# Patient Record
Sex: Female | Born: 1970 | Race: Black or African American | Hispanic: No | Marital: Single | Smoking: Former smoker
Health system: Southern US, Community
[De-identification: ages and names within clinical notes are randomized; demographics above are authoritative.]

## PROBLEM LIST (undated history)

## (undated) DIAGNOSIS — F909 Attention-deficit hyperactivity disorder, unspecified type: Secondary | ICD-10-CM

## (undated) DIAGNOSIS — D649 Anemia, unspecified: Secondary | ICD-10-CM

## (undated) DIAGNOSIS — F17200 Nicotine dependence, unspecified, uncomplicated: Secondary | ICD-10-CM

## (undated) DIAGNOSIS — F1090 Alcohol use, unspecified, uncomplicated: Secondary | ICD-10-CM

## (undated) DIAGNOSIS — Z5189 Encounter for other specified aftercare: Secondary | ICD-10-CM

## (undated) DIAGNOSIS — F319 Bipolar disorder, unspecified: Secondary | ICD-10-CM

## (undated) DIAGNOSIS — F410 Panic disorder [episodic paroxysmal anxiety] without agoraphobia: Secondary | ICD-10-CM

## (undated) DIAGNOSIS — I10 Essential (primary) hypertension: Secondary | ICD-10-CM

## (undated) DIAGNOSIS — F1290 Cannabis use, unspecified, uncomplicated: Secondary | ICD-10-CM

## (undated) DIAGNOSIS — Z7289 Other problems related to lifestyle: Secondary | ICD-10-CM

## (undated) DIAGNOSIS — M1990 Unspecified osteoarthritis, unspecified site: Secondary | ICD-10-CM

## (undated) DIAGNOSIS — F4310 Post-traumatic stress disorder, unspecified: Secondary | ICD-10-CM

## (undated) DIAGNOSIS — F209 Schizophrenia, unspecified: Secondary | ICD-10-CM

## (undated) DIAGNOSIS — D219 Benign neoplasm of connective and other soft tissue, unspecified: Secondary | ICD-10-CM

## (undated) DIAGNOSIS — Z789 Other specified health status: Secondary | ICD-10-CM

## (undated) DIAGNOSIS — F429 Obsessive-compulsive disorder, unspecified: Secondary | ICD-10-CM

## (undated) HISTORY — PX: DILATION AND CURETTAGE OF UTERUS: SHX78

## (undated) HISTORY — DX: Unspecified osteoarthritis, unspecified site: M19.90

## (undated) HISTORY — PX: GANGLION CYST EXCISION: SHX1691

---

## 2011-09-06 ENCOUNTER — Encounter: Payer: Self-pay | Admitting: Neurology

## 2011-09-06 ENCOUNTER — Emergency Department (HOSPITAL_COMMUNITY)
Admission: EM | Admit: 2011-09-06 | Discharge: 2011-09-06 | Disposition: A | Discharge status: Home / Self Care | Payer: Medicaid Other | Attending: Emergency Medicine | Admitting: Emergency Medicine

## 2011-09-06 ENCOUNTER — Other Ambulatory Visit: Payer: Self-pay

## 2011-09-06 DIAGNOSIS — R4589 Other symptoms and signs involving emotional state: Secondary | ICD-10-CM | POA: Insufficient documentation

## 2011-09-06 DIAGNOSIS — M79609 Pain in unspecified limb: Secondary | ICD-10-CM | POA: Insufficient documentation

## 2011-09-06 DIAGNOSIS — R079 Chest pain, unspecified: Secondary | ICD-10-CM | POA: Insufficient documentation

## 2011-09-06 DIAGNOSIS — Z79899 Other long term (current) drug therapy: Secondary | ICD-10-CM | POA: Insufficient documentation

## 2011-09-06 DIAGNOSIS — R0609 Other forms of dyspnea: Secondary | ICD-10-CM | POA: Insufficient documentation

## 2011-09-06 DIAGNOSIS — R112 Nausea with vomiting, unspecified: Secondary | ICD-10-CM | POA: Insufficient documentation

## 2011-09-06 DIAGNOSIS — R0989 Other specified symptoms and signs involving the circulatory and respiratory systems: Secondary | ICD-10-CM | POA: Insufficient documentation

## 2011-09-06 DIAGNOSIS — R6883 Chills (without fever): Secondary | ICD-10-CM | POA: Insufficient documentation

## 2011-09-06 DIAGNOSIS — IMO0002 Reserved for concepts with insufficient information to code with codable children: Secondary | ICD-10-CM | POA: Insufficient documentation

## 2011-09-06 DIAGNOSIS — K2970 Gastritis, unspecified, without bleeding: Secondary | ICD-10-CM | POA: Insufficient documentation

## 2011-09-06 DIAGNOSIS — R10816 Epigastric abdominal tenderness: Secondary | ICD-10-CM | POA: Insufficient documentation

## 2011-09-06 DIAGNOSIS — R1013 Epigastric pain: Secondary | ICD-10-CM | POA: Insufficient documentation

## 2011-09-06 HISTORY — DX: Post-traumatic stress disorder, unspecified: F43.10

## 2011-09-06 HISTORY — DX: Schizophrenia, unspecified: F20.9

## 2011-09-06 HISTORY — DX: Attention-deficit hyperactivity disorder, unspecified type: F90.9

## 2011-09-06 HISTORY — DX: Bipolar disorder, unspecified: F31.9

## 2011-09-06 LAB — CBC
HCT: 32.2 % — ABNORMAL LOW (ref 36.0–46.0)
Hemoglobin: 10.5 g/dL — ABNORMAL LOW (ref 12.0–15.0)
MCH: 26 pg (ref 26.0–34.0)
MCHC: 32.6 g/dL (ref 30.0–36.0)
MCV: 79.7 fL (ref 78.0–100.0)

## 2011-09-06 LAB — URINALYSIS, ROUTINE W REFLEX MICROSCOPIC
Bilirubin Urine: NEGATIVE
Nitrite: NEGATIVE
Specific Gravity, Urine: 1.023 (ref 1.005–1.030)
Urobilinogen, UA: 0.2 mg/dL (ref 0.0–1.0)

## 2011-09-06 LAB — PREGNANCY, URINE: Preg Test, Ur: NEGATIVE

## 2011-09-06 LAB — DIFFERENTIAL
Basophils Relative: 0 % (ref 0–1)
Eosinophils Absolute: 0 10*3/uL (ref 0.0–0.7)
Eosinophils Relative: 0 % (ref 0–5)
Monocytes Absolute: 0.2 10*3/uL (ref 0.1–1.0)
Monocytes Relative: 2 % — ABNORMAL LOW (ref 3–12)

## 2011-09-06 LAB — COMPREHENSIVE METABOLIC PANEL
Albumin: 4.6 g/dL (ref 3.5–5.2)
BUN: 12 mg/dL (ref 6–23)
Chloride: 100 mEq/L (ref 96–112)
Creatinine, Ser: 0.44 mg/dL — ABNORMAL LOW (ref 0.50–1.10)
Total Bilirubin: 0.3 mg/dL (ref 0.3–1.2)
Total Protein: 8.6 g/dL — ABNORMAL HIGH (ref 6.0–8.3)

## 2011-09-06 LAB — URINE MICROSCOPIC-ADD ON

## 2011-09-06 LAB — LIPASE, BLOOD: Lipase: 14 U/L (ref 11–59)

## 2011-09-06 MED ORDER — MORPHINE SULFATE 4 MG/ML IJ SOLN
4.0000 mg | Freq: Once | INTRAMUSCULAR | Status: AC
  Administered 2011-09-06: 4 mg via INTRAVENOUS
  Filled 2011-09-06: qty 1

## 2011-09-06 MED ORDER — HYDROCODONE-ACETAMINOPHEN 5-325 MG PO TABS: 1.0000 | ORAL_TABLET | ORAL | Status: DC | PRN

## 2011-09-06 MED ORDER — OMEPRAZOLE 20 MG PO CPDR: 20.0000 mg | DELAYED_RELEASE_CAPSULE | Freq: Every day | ORAL | Status: DC

## 2011-09-06 MED ORDER — FAMOTIDINE IN NACL 20-0.9 MG/50ML-% IV SOLN
20.0000 mg | Freq: Once | INTRAVENOUS | Status: AC
  Administered 2011-09-06: 20 mg via INTRAVENOUS
  Filled 2011-09-06: qty 50

## 2011-09-06 MED ORDER — SUCRALFATE 1 G PO TABS: 1.0000 g | ORAL_TABLET | Freq: Four times a day (QID) | ORAL | Status: DC

## 2011-09-06 MED ORDER — ONDANSETRON 8 MG PO TBDP: 8.0000 mg | ORAL_TABLET | Freq: Three times a day (TID) | ORAL | Status: DC | PRN

## 2011-09-06 MED ORDER — GI COCKTAIL ~~LOC~~
30.0000 mL | Freq: Once | ORAL | Status: AC
  Administered 2011-09-06: 30 mL via ORAL
  Filled 2011-09-06: qty 30

## 2011-09-06 MED ORDER — ONDANSETRON HCL 4 MG/2ML IJ SOLN
4.0000 mg | Freq: Once | INTRAMUSCULAR | Status: AC
  Administered 2011-09-06: 4 mg via INTRAVENOUS
  Filled 2011-09-06: qty 2

## 2011-09-06 NOTE — ED Notes (Signed)
EKG given to Dr. Lynelle Doctor, copy placed in chart. No old EKG.

## 2011-09-06 NOTE — ED Notes (Signed)
Pt reports nausea, vomiting, chills since last night at 6:00pm. C/o stomach pain, chest discomfort from "vomiting so hard". Pt alert and oriented.. NAD. Reporting  Chest discomfort 10/10. Vitals stable. Pt moving around in bed, unable to sit still.

## 2011-09-06 NOTE — ED Notes (Signed)
Pt unable to sit still, moving around in bed. Asking to get out of bed. Moaning, C/o chest discomfort and stomach pain. Pt appearing restless. Curtain open to visualize patient.

## 2011-09-06 NOTE — ED Provider Notes (Signed)
Medical screening examination/treatment/procedure(s) were performed by non-physician practitioner and as supervising physician I was immediately available for consultation/collaboration.    Vernell Townley R Ruben Pyka, MD 09/06/11 1539 

## 2011-09-06 NOTE — ED Provider Notes (Signed)
History     CSN: [id number] Arrival date & time: 09/06/2011  7:36 AM   First MD Initiated Contact with Patient 09/06/11 [phone]      Chief Complaint  Patient presents with  . Nausea  . Emesis  . Chills    (Consider location/radiation/quality/duration/timing/severity/associated sxs/prior treatment) The history is provided by the patient. The history is limited by the condition of the patient.  Pt c/o constant N/V since eating dinner at seafood restaurant yesterday evening.  Associated w/ severe epigastric pain, CP and dyspnea.  She thinks the CP may be from vomiting or acid reflux.  Denies hematemesis, diarrhea and GU symptoms.  Has taken 2 hydrocodone and nausea medicine w/out relief.  Denies drug/alcohol abuse.  No food allergies.    Past Medical History  Diagnosis Date  . Bipolar 1 disorder   . PTSD (post-traumatic stress disorder)   . ADHD (attention deficit hyperactivity disorder)   . Schizophrenia     Past Surgical History  Procedure Date  . Ganglion cyst excision     No family history on file.  History  Substance Use Topics  . Smoking status: Never Smoker   . Smokeless tobacco: Not on file  . Alcohol Use: No    OB History    Grav Para Term Preterm Abortions TAB SAB Ect Mult Living                  Review of Systems  All other systems reviewed and are negative.    Allergies  Review of patient's allergies indicates no known allergies.  Home Medications   Current Outpatient Rx  Name Route Sig Dispense Refill  . HYDROCODONE-ACETAMINOPHEN 5-500 MG PO TABS Oral Take 1 tablet by mouth every 6 (six) hours as needed. For pain     . LAMOTRIGINE 150 MG PO TABS Oral Take 150 mg by mouth daily.      Marland Kitchen PRESCRIPTION MEDICATION  Zoloft Strength unknown     . PRESCRIPTION MEDICATION  Dicyclomine  Strength unknown       BP 178/89  Pulse 74  Temp(Src) 98 F (36.7 C) (Oral)  Resp 18  SpO2 100%  LMP 08/24/2011  Physical Exam  Nursing note and vitals  reviewed. Constitutional: She is oriented to person, place, and time. She appears well-developed and well-nourished. No distress.  HENT:  Head: Normocephalic and atraumatic.  Eyes:       Normal appearance  Neck: Normal range of motion.  Cardiovascular: Normal rate and regular rhythm.   Pulmonary/Chest: Effort normal and breath sounds normal.  Abdominal: Soft. Bowel sounds are normal. She exhibits no distension and no mass. There is tenderness. There is guarding. There is no rebound.       Epigastric ttp  Musculoskeletal: Normal range of motion. She exhibits no edema.       DIffuse LE tenderness  Neurological: She is alert and oriented to person, place, and time.  Skin: Skin is warm and dry. No rash noted.  Psychiatric: She has a normal mood and affect. Her behavior is normal.       Pt restless and mildly agitated.     ED Course  Procedures (including critical care time)  Labs Reviewed  CBC - Abnormal; Notable for the following:    WBC 11.1 (*)    Hemoglobin 10.5 (*)    HCT 32.2 (*)    RDW 20.0 (*)    Platelets 424 (*)    All other components within normal limits  DIFFERENTIAL - Abnormal;  Notable for the following:    Neutrophils Relative 93 (*)    Neutro Abs 10.3 (*)    Lymphocytes Relative 6 (*)    Lymphs Abs 0.6 (*)    Monocytes Relative 2 (*)    All other components within normal limits  COMPREHENSIVE METABOLIC PANEL - Abnormal; Notable for the following:    Potassium 3.2 (*)    Glucose, Bld 143 (*)    Creatinine, Ser 0.44 (*)    Calcium 11.1 (*)    Total Protein 8.6 (*)    All other components within normal limits  URINALYSIS, ROUTINE W REFLEX MICROSCOPIC - Abnormal; Notable for the following:    APPearance CLOUDY (*)    Ketones, ur >80 (*)    Protein, ur 100 (*)    All other components within normal limits  URINE MICROSCOPIC-ADD ON - Abnormal; Notable for the following:    Bacteria, UA MANY (*)    All other components within normal limits  LIPASE, BLOOD   PREGNANCY, URINE   No results found.   1. Gastritis       MDM  Pt presents w/ epigastric/mid-line chest pain and N/V since last night.  Experiences this pain frequently.  On exam, uncomfortable appearing, restless, afebrile, abd benign but epigastric tenderness.  Labs unremarkable.  Pt most likely has gastritis.  Received a GI cocktail, IV morphine and zofran and symptoms improved.  She is tolerating pos.  Will d/c home w/ prilosec, carafate, vicodin and zofran.  Return precautions discussed.        Otilio Miu, [place] 09/06/11 1038

## 2011-09-06 NOTE — ED Provider Notes (Signed)
Date: 09/06/2011  Rate: 66  Rhythm: normal sinus rhythm  QRS Axis: normal  Intervals: normal  ST/T Wave abnormalities: normal  Conduction Disutrbances:none  Narrative Interpretation:   Old EKG Reviewed: none available  Medical screening examination/treatment/procedure(s) were performed by non-physician practitioner and as supervising physician I was immediately available for consultation/collaboration.   Celene Kras, MD 09/06/11 1540

## 2011-09-08 ENCOUNTER — Other Ambulatory Visit: Payer: Self-pay

## 2011-09-08 ENCOUNTER — Encounter (HOSPITAL_COMMUNITY): Payer: Self-pay | Admitting: *Deleted

## 2011-09-08 ENCOUNTER — Emergency Department (HOSPITAL_COMMUNITY)
Admission: EM | Admit: 2011-09-08 | Discharge: 2011-09-08 | Discharge status: Against Medical Advice | Payer: Medicaid Other | Attending: Emergency Medicine | Admitting: Emergency Medicine

## 2011-09-08 DIAGNOSIS — R0602 Shortness of breath: Secondary | ICD-10-CM | POA: Insufficient documentation

## 2011-09-08 DIAGNOSIS — R079 Chest pain, unspecified: Secondary | ICD-10-CM | POA: Insufficient documentation

## 2011-09-08 NOTE — ED Notes (Signed)
Reports sudden onset of mid and left side chest pain that occurred suddenly after eating. Having n/v x 1 and sob. spo2 100% ekg being done at triage.

## 2011-12-29 ENCOUNTER — Emergency Department (INDEPENDENT_AMBULATORY_CARE_PROVIDER_SITE_OTHER): Payer: Medicaid Other

## 2011-12-29 ENCOUNTER — Emergency Department (HOSPITAL_BASED_OUTPATIENT_CLINIC_OR_DEPARTMENT_OTHER)
Admission: EM | Admit: 2011-12-29 | Discharge: 2011-12-29 | Disposition: A | Discharge status: Home / Self Care | Payer: Medicaid Other | Attending: Emergency Medicine | Admitting: Emergency Medicine

## 2011-12-29 ENCOUNTER — Encounter (HOSPITAL_BASED_OUTPATIENT_CLINIC_OR_DEPARTMENT_OTHER): Payer: Self-pay | Admitting: Emergency Medicine

## 2011-12-29 DIAGNOSIS — E876 Hypokalemia: Secondary | ICD-10-CM | POA: Insufficient documentation

## 2011-12-29 DIAGNOSIS — R059 Cough, unspecified: Secondary | ICD-10-CM | POA: Insufficient documentation

## 2011-12-29 DIAGNOSIS — R10819 Abdominal tenderness, unspecified site: Secondary | ICD-10-CM | POA: Insufficient documentation

## 2011-12-29 DIAGNOSIS — R109 Unspecified abdominal pain: Secondary | ICD-10-CM | POA: Insufficient documentation

## 2011-12-29 DIAGNOSIS — R112 Nausea with vomiting, unspecified: Secondary | ICD-10-CM | POA: Insufficient documentation

## 2011-12-29 DIAGNOSIS — R5381 Other malaise: Secondary | ICD-10-CM | POA: Insufficient documentation

## 2011-12-29 DIAGNOSIS — R05 Cough: Secondary | ICD-10-CM

## 2011-12-29 LAB — CBC
MCH: 23.1 pg — ABNORMAL LOW (ref 26.0–34.0)
Platelets: 310 10*3/uL (ref 150–400)
RBC: 4.29 MIL/uL (ref 3.87–5.11)
RDW: 20.9 % — ABNORMAL HIGH (ref 11.5–15.5)

## 2011-12-29 LAB — DIFFERENTIAL
Basophils Absolute: 0 10*3/uL (ref 0.0–0.1)
Basophils Relative: 0 % (ref 0–1)
Eosinophils Absolute: 0 10*3/uL (ref 0.0–0.7)
Eosinophils Relative: 0 % (ref 0–5)
Lymphs Abs: 2 10*3/uL (ref 0.7–4.0)
Neutrophils Relative %: 74 % (ref 43–77)

## 2011-12-29 LAB — COMPREHENSIVE METABOLIC PANEL
ALT: 19 U/L (ref 0–35)
CO2: 26 mEq/L (ref 19–32)
Calcium: 11 mg/dL — ABNORMAL HIGH (ref 8.4–10.5)
Chloride: 100 mEq/L (ref 96–112)
Creatinine, Ser: 0.6 mg/dL (ref 0.50–1.10)
GFR calc Af Amer: >90 mL/min (ref >90)
GFR calc non Af Amer: >90 mL/min (ref >90)
Glucose, Bld: 109 mg/dL — ABNORMAL HIGH (ref 70–99)
Sodium: 137 mEq/L (ref 135–145)
Total Bilirubin: 0.4 mg/dL (ref 0.3–1.2)

## 2011-12-29 LAB — URINE MICROSCOPIC-ADD ON

## 2011-12-29 LAB — URINALYSIS, ROUTINE W REFLEX MICROSCOPIC
Glucose, UA: NEGATIVE mg/dL
Ketones, ur: NEGATIVE mg/dL
Leukocytes, UA: NEGATIVE
Nitrite: NEGATIVE
Protein, ur: 30 mg/dL — AB
Urobilinogen, UA: 1 mg/dL (ref 0.0–1.0)

## 2011-12-29 MED ORDER — POTASSIUM CHLORIDE CRYS ER 20 MEQ PO TBCR
20.0000 meq | EXTENDED_RELEASE_TABLET | Freq: Once | ORAL | Status: AC
  Administered 2011-12-29: 20 meq via ORAL
  Filled 2011-12-29: qty 1

## 2011-12-29 MED ORDER — FAMOTIDINE IN NACL 20-0.9 MG/50ML-% IV SOLN
20.0000 mg | Freq: Once | INTRAVENOUS | Status: AC
  Administered 2011-12-29: 20 mg via INTRAVENOUS
  Filled 2011-12-29: qty 50

## 2011-12-29 MED ORDER — HYOSCYAMINE SULFATE 0.125 MG PO TABS
0.2500 mg | ORAL_TABLET | Freq: Once | ORAL | Status: DC
  Filled 2011-12-29: qty 1

## 2011-12-29 MED ORDER — METOCLOPRAMIDE HCL 5 MG/ML IJ SOLN
10.0000 mg | Freq: Once | INTRAMUSCULAR | Status: AC
  Administered 2011-12-29: 10 mg via INTRAVENOUS
  Filled 2011-12-29: qty 2

## 2011-12-29 MED ORDER — HYOSCYAMINE SULFATE 0.125 MG PO TABS
0.2500 mg | ORAL_TABLET | Freq: Once | ORAL | Status: AC
  Administered 2011-12-29: 0.25 mg via ORAL

## 2011-12-29 MED ORDER — MORPHINE SULFATE 4 MG/ML IJ SOLN
4.0000 mg | Freq: Once | INTRAMUSCULAR | Status: AC
  Administered 2011-12-29: 4 mg via INTRAVENOUS
  Filled 2011-12-29: qty 1

## 2011-12-29 MED ORDER — ONDANSETRON HCL 4 MG/2ML IJ SOLN
INTRAMUSCULAR | Status: AC
  Administered 2011-12-29: 11:00:00
  Filled 2011-12-29: qty 2

## 2011-12-29 MED ORDER — HYOSCYAMINE SULFATE 0.125 MG PO TABS
0.2500 mg | ORAL_TABLET | Freq: Once | ORAL | Status: DC
  Filled 2011-12-29: qty 2

## 2011-12-29 MED ORDER — PROMETHAZINE HCL 25 MG RE SUPP: 25.0000 mg | Freq: Four times a day (QID) | RECTAL | Status: DC | PRN

## 2011-12-29 MED ORDER — ONDANSETRON HCL 4 MG/2ML IJ SOLN
8.0000 mg | Freq: Once | INTRAMUSCULAR | Status: AC
  Administered 2011-12-29: 8 mg via INTRAVENOUS
  Filled 2011-12-29: qty 2

## 2011-12-29 MED ORDER — ONDANSETRON 8 MG PO TBDP: 8.0000 mg | ORAL_TABLET | Freq: Three times a day (TID) | ORAL | Status: AC | PRN

## 2011-12-29 MED ORDER — HYOSCYAMINE SULFATE 0.125 MG SL SUBL
0.2500 mg | SUBLINGUAL_TABLET | Freq: Once | SUBLINGUAL | Status: DC
  Filled 2011-12-29: qty 2

## 2011-12-29 MED ORDER — SODIUM CHLORIDE 0.9 % IV BOLUS (SEPSIS)
1000.0000 mL | Freq: Once | INTRAVENOUS | Status: AC
  Administered 2011-12-29: 1000 mL via INTRAVENOUS

## 2011-12-29 NOTE — ED Notes (Signed)
Sara Yates and drink given to the patient per Dr. Elmer Bales order. The patient is sitting up and trying the food and drink.

## 2011-12-29 NOTE — ED Notes (Signed)
While discharging pt she asked if she could have a refill on her current vicodin 5/500mg  script.  Pt states that she does not have a current prescriber for this medication and that she needs a refill.  Pt states that she is trying to get into see pain management but is concerned because she only has 4 pills left.  Dr Brooke Dare informed, he declined to refill this medication due to her having ultram and vicodin at home.  Pt was advised per Dr Brooke Dare to take those medications for her pain and followup with PCP if she needs a refill on vicodin.  Pt voiced understanding.

## 2011-12-29 NOTE — ED Provider Notes (Signed)
History     CSN: [id number]  Arrival date & time 12/29/11  1032   First MD Initiated Contact with Patient 12/29/11 1032      Chief Complaint  Patient presents with  . Nausea  . Abdominal Pain  . Cough    (Consider location/radiation/quality/duration/timing/severity/associated sxs/prior treatment) Patient is a 41 y.o. female presenting with abdominal pain. The history is provided by the patient. No language interpreter was used.  Abdominal Pain The primary symptoms of the illness include abdominal pain, fatigue, nausea and vomiting. The primary symptoms of the illness do not include fever, shortness of breath, diarrhea or dysuria. The current episode started 2 days ago. The onset of the illness was gradual. The problem has been gradually worsening.  The abdominal pain began 2 days ago. The pain came on gradually. The abdominal pain has been gradually worsening since its onset. The abdominal pain is generalized. The abdominal pain does not radiate. The abdominal pain is relieved by nothing. The abdominal pain is exacerbated by vomiting.  Nausea began 2 days ago. The nausea is associated with eating. The nausea is exacerbated by food.  The vomiting began 2 days ago. Vomiting occurs 2 to 5 times per day. The emesis contains stomach contents.  Symptoms associated with the illness do not include chills, constipation, urgency, frequency or back pain.    Past Medical History  Diagnosis Date  . Bipolar 1 disorder   . PTSD (post-traumatic stress disorder)   . ADHD (attention deficit hyperactivity disorder)   . Schizophrenia     Past Surgical History  Procedure Date  . Ganglion cyst excision     History reviewed. No pertinent family history.  History  Substance Use Topics  . Smoking status: Never Smoker   . Smokeless tobacco: Not on file  . Alcohol Use: No    OB History    Grav Para Term Preterm Abortions TAB SAB Ect Mult Living                  Review of Systems    Constitutional: Positive for fatigue. Negative for fever, chills, activity change and appetite change.  HENT: Positive for congestion. Negative for sore throat, rhinorrhea, neck pain and neck stiffness.   Respiratory: Positive for cough. Negative for shortness of breath.   Cardiovascular: Negative for chest pain and palpitations.  Gastrointestinal: Positive for nausea, vomiting and abdominal pain. Negative for diarrhea and constipation.  Genitourinary: Negative for dysuria, urgency, frequency and flank pain.  Musculoskeletal: Negative for myalgias, back pain and arthralgias.  Neurological: Negative for dizziness, weakness, light-headedness, numbness and headaches.  All other systems reviewed and are negative.    Allergies  Review of patient's allergies indicates no known allergies.  Home Medications   Current Outpatient Rx  Name Route Sig Dispense Refill  . ARIPIPRAZOLE 15 MG PO TABS Oral Take 15 mg by mouth daily.    Marland Kitchen DICYCLOMINE HCL 10 MG PO CAPS Oral Take 10 mg by mouth 3 (three) times daily between meals as needed. For stomach spasms     . HYDROCODONE-ACETAMINOPHEN 5-500 MG PO TABS Oral Take 1 tablet by mouth every 6 (six) hours as needed.    Marland Kitchen LAMOTRIGINE 100 MG PO TABS Oral Take 100 mg by mouth daily.    Marland Kitchen LISDEXAMFETAMINE DIMESYLATE 50 MG PO CAPS Oral Take 50 mg by mouth every morning.    Marland Kitchen OMEPRAZOLE 20 MG PO CPDR Oral Take 20 mg by mouth daily.      Marland Kitchen PROMETHAZINE HCL  25 MG PO TABS Oral Take 25 mg by mouth every 6 (six) hours as needed.    . SERTRALINE HCL 100 MG PO TABS Oral Take 150 mg by mouth daily.     . TRAMADOL HCL 50 MG PO TABS Oral Take 50 mg by mouth 2 (two) times daily as needed.    . TRAZODONE HCL 100 MG PO TABS Oral Take 50 mg by mouth at bedtime.     Marland Kitchen ONDANSETRON 8 MG PO TBDP Oral Take 1 tablet (8 mg total) by mouth every 8 (eight) hours as needed for nausea. 20 tablet 0  . PROMETHAZINE HCL 25 MG RE SUPP Rectal Place 1 suppository (25 mg total) rectally every  6 (six) hours as needed for nausea. 12 each 0    BP 130/69  Pulse 58  Temp(Src) 98.4 F (36.9 C) (Oral)  Resp 20  SpO2 100%  LMP 12/22/2011  Physical Exam  Nursing note and vitals reviewed. Constitutional: She is oriented to person, place, and time. She appears well-developed and well-nourished. No distress.  HENT:  Head: Normocephalic and atraumatic.  Mouth/Throat: Oropharynx is clear and moist.  Eyes: Conjunctivae and EOM are normal. Pupils are equal, round, and reactive to light.  Neck: Normal range of motion. Neck supple.  Cardiovascular: Normal rate, regular rhythm, normal heart sounds and intact distal pulses.  Exam reveals no gallop and no friction rub.   No murmur heard. Pulmonary/Chest: Effort normal and breath sounds normal. No respiratory distress. She exhibits no tenderness.  Abdominal: Soft. Bowel sounds are normal. There is tenderness (diffuse). There is no rebound and no guarding.  Musculoskeletal: Normal range of motion. She exhibits no tenderness.  Neurological: She is alert and oriented to person, place, and time. No cranial nerve deficit.  Skin: Skin is warm and dry. No rash noted.    ED Course  Procedures (including critical care time)  Labs Reviewed  COMPREHENSIVE METABOLIC PANEL - Abnormal; Notable for the following:    Potassium 3.4 (*)    Glucose, Bld 109 (*)    Calcium 11.0 (*)    Total Protein 8.7 (*)    All other components within normal limits  CBC - Abnormal; Notable for the following:    WBC 11.8 (*)    Hemoglobin 9.9 (*)    HCT 31.2 (*)    MCV 72.7 (*)    MCH 23.1 (*)    RDW 20.9 (*)    All other components within normal limits  DIFFERENTIAL - Abnormal; Notable for the following:    Neutro Abs 8.7 (*)    Monocytes Absolute 1.1 (*)    All other components within normal limits  URINALYSIS, ROUTINE W REFLEX MICROSCOPIC - Abnormal; Notable for the following:    Hgb urine dipstick SMALL (*)    Protein, ur 30 (*)    All other components  within normal limits  URINE MICROSCOPIC-ADD ON - Abnormal; Notable for the following:    Bacteria, UA MANY (*)    Casts HYALINE CASTS (*)    All other components within normal limits  LIPASE, BLOOD   Dg Chest 2 View  12/29/2011  *RADIOLOGY REPORT*  Clinical Data: cough  CHEST - 2 VIEW  Comparison: None.  Findings: The heart size and mediastinal contours are within normal limits.  Both lungs are clear.  The visualized skeletal structures are unremarkable.  IMPRESSION: Negative exam.  Original Report Authenticated By: Rosealee Albee, M.D.     1. Nausea and vomiting   2.  Hypokalemia       MDM  Nausea and vomiting, hypokalemia. Her potassium was repleted the emergency department. She received a liter of IV fluids and antiemetics. Was able tolerate oral intake without difficulty. Will be discharged home with Tigan suppositories and Zofran ODT. She has a prescription for Bentyl. I have no concern about a malignant cause of her symptoms. This is an ongoing chronic issue.        Dayton Bailiff, MD 12/29/11 1308

## 2011-12-29 NOTE — ED Notes (Signed)
Per EMS:  Pt having N/V and abdominal pain since Friday.  Some chest wall soreness from vomiting and recent cough.  No known fever, some cold chills.

## 2011-12-29 NOTE — Discharge Instructions (Signed)
Nausea and Vomiting  Nausea is a sick feeling that often comes before throwing up (vomiting). Vomiting is a reflex where stomach contents come out of your mouth. Vomiting can cause severe loss of body fluids (dehydration). Children and elderly adults can become dehydrated quickly, especially if they also have diarrhea. Nausea and vomiting are symptoms of a condition or disease. It is important to find the cause of your symptoms.  CAUSES    Direct irritation of the stomach lining. This irritation can result from increased acid production (gastroesophageal reflux disease), infection, food poisoning, taking certain medicines (such as nonsteroidal anti-inflammatory drugs), alcohol use, or tobacco use.   Signals from the brain.These signals could be caused by a headache, heat exposure, an inner ear disturbance, increased pressure in the brain from injury, infection, a tumor, or a concussion, pain, emotional stimulus, or metabolic problems.   An obstruction in the gastrointestinal tract (bowel obstruction).   Illnesses such as diabetes, hepatitis, gallbladder problems, appendicitis, kidney problems, cancer, sepsis, atypical symptoms of a heart attack, or eating disorders.   Medical treatments such as chemotherapy and radiation.   Receiving medicine that makes you sleep (general anesthetic) during surgery.  DIAGNOSIS  Your caregiver may ask for tests to be done if the problems do not improve after a few days. Tests may also be done if symptoms are severe or if the reason for the nausea and vomiting is not clear. Tests may include:   Urine tests.   Blood tests.   Stool tests.   Cultures (to look for evidence of infection).   X-rays or other imaging studies.  Test results can help your caregiver make decisions about treatment or the need for additional tests.  TREATMENT  You need to stay well hydrated. Drink frequently but in small amounts.You may wish to drink water, sports drinks, clear broth, or eat frozen  ice pops or gelatin dessert to help stay hydrated.When you eat, eating slowly may help prevent nausea.There are also some antinausea medicines that may help prevent nausea.  HOME CARE INSTRUCTIONS    Take all medicine as directed by your caregiver.   If you do not have an appetite, do not force yourself to eat. However, you must continue to drink fluids.   If you have an appetite, eat a normal diet unless your caregiver tells you differently.   Eat a variety of complex carbohydrates (rice, wheat, potatoes, bread), lean meats, yogurt, fruits, and vegetables.   Avoid high-fat foods because they are more difficult to digest.   Drink enough water and fluids to keep your urine clear or pale yellow.   If you are dehydrated, ask your caregiver for specific rehydration instructions. Signs of dehydration may include:   Severe thirst.   Dry lips and mouth.   Dizziness.   Dark urine.   Decreasing urine frequency and amount.   Confusion.   Rapid breathing or pulse.  SEEK IMMEDIATE MEDICAL CARE IF:    You have blood or brown flecks (like coffee grounds) in your vomit.   You have black or bloody stools.   You have a severe headache or stiff neck.   You are confused.   You have severe abdominal pain.   You have chest pain or trouble breathing.   You do not urinate at least once every 8 hours.   You develop cold or clammy skin.   You continue to vomit for longer than 24 to 48 hours.   You have a fever.  MAKE SURE YOU:      Understand these instructions.   Will watch your condition.   Will get help right away if you are not doing well or get worse.  Document Released: 09/09/2005 Document Revised: 08/29/2011 Document Reviewed: 02/06/2011  [employer] Patient Information 2012 [employer]

## 2012-01-01 ENCOUNTER — Emergency Department (HOSPITAL_BASED_OUTPATIENT_CLINIC_OR_DEPARTMENT_OTHER)
Admission: EM | Admit: 2012-01-01 | Discharge: 2012-01-01 | Disposition: A | Discharge status: Home / Self Care | Payer: Medicaid Other | Attending: Emergency Medicine | Admitting: Emergency Medicine

## 2012-01-01 ENCOUNTER — Emergency Department (INDEPENDENT_AMBULATORY_CARE_PROVIDER_SITE_OTHER): Payer: Medicaid Other

## 2012-01-01 ENCOUNTER — Encounter (HOSPITAL_BASED_OUTPATIENT_CLINIC_OR_DEPARTMENT_OTHER): Payer: Self-pay | Admitting: Emergency Medicine

## 2012-01-01 DIAGNOSIS — R6883 Chills (without fever): Secondary | ICD-10-CM | POA: Insufficient documentation

## 2012-01-01 DIAGNOSIS — R5383 Other fatigue: Secondary | ICD-10-CM | POA: Insufficient documentation

## 2012-01-01 DIAGNOSIS — R112 Nausea with vomiting, unspecified: Secondary | ICD-10-CM

## 2012-01-01 DIAGNOSIS — K824 Cholesterolosis of gallbladder: Secondary | ICD-10-CM

## 2012-01-01 DIAGNOSIS — R1013 Epigastric pain: Secondary | ICD-10-CM | POA: Insufficient documentation

## 2012-01-01 DIAGNOSIS — R079 Chest pain, unspecified: Secondary | ICD-10-CM

## 2012-01-01 DIAGNOSIS — R5381 Other malaise: Secondary | ICD-10-CM | POA: Insufficient documentation

## 2012-01-01 HISTORY — DX: Panic disorder (episodic paroxysmal anxiety): F41.0

## 2012-01-01 HISTORY — DX: Obsessive-compulsive disorder, unspecified: F42.9

## 2012-01-01 LAB — DIFFERENTIAL
Basophils Absolute: 0 10*3/uL (ref 0.0–0.1)
Basophils Relative: 0 % (ref 0–1)
Eosinophils Absolute: 0 10*3/uL (ref 0.0–0.7)
Eosinophils Relative: 0 % (ref 0–5)
Lymphocytes Relative: 20 % (ref 12–46)
Lymphs Abs: 2.2 10*3/uL (ref 0.7–4.0)
Monocytes Absolute: 1.2 10*3/uL — ABNORMAL HIGH (ref 0.1–1.0)
Monocytes Relative: 10 % (ref 3–12)
Neutro Abs: 7.9 10*3/uL — ABNORMAL HIGH (ref 1.7–7.7)
Neutrophils Relative %: 70 % (ref 43–77)

## 2012-01-01 LAB — COMPREHENSIVE METABOLIC PANEL
AST: 14 U/L (ref 0–37)
Albumin: 4.5 g/dL (ref 3.5–5.2)
Creatinine, Ser: 0.8 mg/dL (ref 0.50–1.10)
Sodium: 135 mEq/L (ref 135–145)
Total Bilirubin: 0.3 mg/dL (ref 0.3–1.2)

## 2012-01-01 LAB — URINALYSIS, ROUTINE W REFLEX MICROSCOPIC
Glucose, UA: NEGATIVE mg/dL
Ketones, ur: NEGATIVE mg/dL
Leukocytes, UA: NEGATIVE
Specific Gravity, Urine: 1.015 (ref 1.005–1.030)
pH: 7 (ref 5.0–8.0)

## 2012-01-01 LAB — CBC
HCT: 29 % — ABNORMAL LOW (ref 36.0–46.0)
Hemoglobin: 9.2 g/dL — ABNORMAL LOW (ref 12.0–15.0)
MCH: 23.5 pg — ABNORMAL LOW (ref 26.0–34.0)
MCV: 74.2 fL — ABNORMAL LOW (ref 78.0–100.0)
Platelets: 189 10*3/uL (ref 150–400)
RBC: 3.91 MIL/uL (ref 3.87–5.11)
WBC: 11.3 10*3/uL — ABNORMAL HIGH (ref 4.0–10.5)

## 2012-01-01 LAB — LIPASE, BLOOD: Lipase: 60 U/L — ABNORMAL HIGH (ref 11–59)

## 2012-01-01 LAB — PREGNANCY, URINE: Preg Test, Ur: NEGATIVE

## 2012-01-01 MED ORDER — SODIUM CHLORIDE 0.9 % IV BOLUS (SEPSIS)
1000.0000 mL | Freq: Once | INTRAVENOUS | Status: AC
  Administered 2012-01-01: 1000 mL via INTRAVENOUS

## 2012-01-01 MED ORDER — GI COCKTAIL ~~LOC~~
30.0000 mL | Freq: Once | ORAL | Status: AC
  Administered 2012-01-01: 30 mL via ORAL
  Filled 2012-01-01: qty 30

## 2012-01-01 MED ORDER — OMEPRAZOLE 20 MG PO CPDR: 20.0000 mg | DELAYED_RELEASE_CAPSULE | Freq: Every day | ORAL | Status: DC

## 2012-01-01 MED ORDER — PANTOPRAZOLE SODIUM 40 MG IV SOLR
40.0000 mg | Freq: Once | INTRAVENOUS | Status: AC
  Administered 2012-01-01: 40 mg via INTRAVENOUS
  Filled 2012-01-01: qty 40

## 2012-01-01 MED ORDER — ONDANSETRON HCL 4 MG/2ML IJ SOLN
4.0000 mg | Freq: Once | INTRAMUSCULAR | Status: AC
  Administered 2012-01-01: 4 mg via INTRAVENOUS
  Filled 2012-01-01: qty 2

## 2012-01-01 MED ORDER — ONDANSETRON 4 MG PO TBDP: 4.0000 mg | ORAL_TABLET | Freq: Three times a day (TID) | ORAL | Status: AC | PRN

## 2012-01-01 MED ORDER — FENTANYL CITRATE 0.05 MG/ML IJ SOLN
50.0000 ug | Freq: Once | INTRAMUSCULAR | Status: AC
  Administered 2012-01-01: 50 ug via INTRAVENOUS
  Filled 2012-01-01: qty 2

## 2012-01-01 NOTE — ED Notes (Signed)
Secondary Assessment- Pt reports she has epigastric pain, chest pain, vomiting and unable to keep any PO fluids down.  Seen here Friday for same but not improving with medications given.

## 2012-01-01 NOTE — ED Notes (Signed)
Pt reports epigastric, chest and abdominal pain that started after eating at a restaurant Friday.  Pt reports she is unable to keep anything down and was seen for same Friday.

## 2012-01-01 NOTE — Discharge Instructions (Signed)
Nausea and Vomiting Nausea is a sick feeling that often comes before throwing up (vomiting). Vomiting is a reflex where stomach contents come out of your mouth. Vomiting can cause severe loss of body fluids (dehydration). Children and elderly adults can become dehydrated quickly, especially if they also have diarrhea. Nausea and vomiting are symptoms of a condition or disease. It is important to find the cause of your symptoms. CAUSES   Direct irritation of the stomach lining. This irritation can result from increased acid production (gastroesophageal reflux disease), infection, food poisoning, taking certain medicines (such as nonsteroidal anti-inflammatory drugs), alcohol use, or tobacco use.   Signals from the brain.These signals could be caused by a headache, heat exposure, an inner ear disturbance, increased pressure in the brain from injury, infection, a tumor, or a concussion, pain, emotional stimulus, or metabolic problems.   An obstruction in the gastrointestinal tract (bowel obstruction).   Illnesses such as diabetes, hepatitis, gallbladder problems, appendicitis, kidney problems, cancer, sepsis, atypical symptoms of a heart attack, or eating disorders.   Medical treatments such as chemotherapy and radiation.   Receiving medicine that makes you sleep (general anesthetic) during surgery.  DIAGNOSIS Your caregiver may ask for tests to be done if the problems do not improve after a few days. Tests may also be done if symptoms are severe or if the reason for the nausea and vomiting is not clear. Tests may include:  Urine tests.   Blood tests.   Stool tests.   Cultures (to look for evidence of infection).   X-rays or other imaging studies.  Test results can help your caregiver make decisions about treatment or the need for additional tests. TREATMENT You need to stay well hydrated. Drink frequently but in small amounts.You may wish to drink water, sports drinks, clear broth, or  eat frozen ice pops or gelatin dessert to help stay hydrated.When you eat, eating slowly may help prevent nausea.There are also some antinausea medicines that may help prevent nausea. HOME CARE INSTRUCTIONS   Take all medicine as directed by your caregiver.   If you do not have an appetite, do not force yourself to eat. However, you must continue to drink fluids.   If you have an appetite, eat a normal diet unless your caregiver tells you differently.   Eat a variety of complex carbohydrates (rice, wheat, potatoes, bread), lean meats, yogurt, fruits, and vegetables.   Avoid high-fat foods because they are more difficult to digest.   Drink enough water and fluids to keep your urine clear or pale yellow.   If you are dehydrated, ask your caregiver for specific rehydration instructions. Signs of dehydration may include:   Severe thirst.   Dry lips and mouth.   Dizziness.   Dark urine.   Decreasing urine frequency and amount.   Confusion.   Rapid breathing or pulse.  SEEK IMMEDIATE MEDICAL CARE IF:   You have blood or brown flecks (like coffee grounds) in your vomit.   You have black or bloody stools.   You have a severe headache or stiff neck.   You are confused.   You have severe abdominal pain.   You have chest pain or trouble breathing.   You do not urinate at least once every 8 hours.   You develop cold or clammy skin.   You continue to vomit for longer than 24 to 48 hours.   You have a fever.  MAKE SURE YOU:   Understand these instructions.   Will watch your  condition.   Will get help right away if you are not doing well or get worse.  Document Released: 09/09/2005 Document Revised: 08/29/2011 Document Reviewed: 02/06/2011 [facility] Patient Information 2012 [place], [place].Peptic Ulcers Ulcers are small, open craters or sores that develop in the lining of the stomach or the duodenum (the first part of the small intestine). The term peptic ulcer is used  to describe both types of ulcers. There are a number of treatments that relieve the discomfort of ulcers. In most cases ulcers do heal.  CAUSES AND COMMON FEATURES OF PEPTIC ULCERS  Peptic ulcers occur only in areas of the digestive system that come in contact with digestive juices. These juices are secreted (given off) by the stomach. They include acid and an enzyme called pepsin that breaks down proteins. Many people with duodenal ulcers have too much digestive juice spilling down from the stomach. Most people with gastric (stomach) ulcers have normal or below normal amounts of stomach acid. Sometimes, when the mucous membrane (protective lining) of the stomach and duodenum does not protect well, this may add to the growth of ulcers. Duodenal ulcers often produces pain in a small area between the breastbone and navel. Pain varies from hunger pain to constant gnawing or burning sensations (feeling). Sometimes the pain is felt during sleep and may awaken the person in the middle of the night. Often the pain occurs two or three hours after eating, when the stomach is empty. Other common symptoms (problems) include overeating for pain relief. Eating relieves the pain of a duodenal ulcer. Gastric ulcer pain may be felt in the same place as the pain of a duodenal ulcer, or slightly higher up. There may also be sensations of feeling full, indigestion, and heartburn. Sometimes pain occurs when the stomach is full. This causes loss of appetite followed by weight loss. HOME CARE INSTRUCTIONS   Use of tobacco products have been found to slow down the healing of an ulcer. STOP SMOKING.   Avoid alcohol, aspirin, and other inflammation (swelling and soreness) reducing drugs. These substances weaken the stomach lining.   Eat regular, nutritious meals.   Avoid foods that bother you.   Take medications and antacids as directed. Over-the-counter medications are used to neutralize stomach acid. Prescription  medications reduce acid secretion, block acid production, or provide a protective coating over the ulcer. If a specific antacid was prescribed, do not switch brands without your caregiver's approval.  Surgery is usually not necessary. Diet and/or drug therapy usually is effective. Surgery may be necessary if perforation, obstruction due to scarring, or uncontrollable bleeding is found, or if severe pain is not otherwise controlled. SEEK IMMEDIATE MEDICAL CARE IF:  You see signs of bleeding. This includes vomiting fresh, bright red blood or passing bloody or tarry, black stools.   You suffer weakness, fatigue, or loss of consciousness. These symptoms can result from severe hemorrhaging (bleeding). Shock may result.   You have sudden, intense, severe abdominal (belly) pain. This is the first sign of a perforation. This would require immediate surgical treatment.   You have intense pain and continued vomiting. This could signal an obstruction of the digestive tract.  Document Released: 09/06/2000 Document Revised: 08/29/2011 Document Reviewed: 09/05/2008 [facility] Patient Information 2012 [place], [place].

## 2012-01-01 NOTE — ED Provider Notes (Addendum)
History     CSN: [id number]  Arrival date & time 01/01/12  0830   First MD Initiated Contact with Patient 01/01/12 [phone]      No chief complaint on file.   (Consider location/radiation/quality/duration/timing/severity/associated sxs/prior treatment) HPI Comments: Patient presents complaining of persistent epigastric pain and nausea and vomiting since Friday.  She states she would not eat that night and started vomiting at the restaurant.  She was seen in this emergency department 3 days ago and had laboratory studies sent and was able to be discharged home with her symptoms having improved.  Patient notes that she's continued to have epigastric pain without radiation.  She's tried to drink fluids or any crackers and has been unable to keep much of anything down.  She's been using the medications as directed by the emergency physician at discharge and still states she cannot control her symptoms.  She notes she has had an EGD before and was not found to have an ulcer or other specific abnormalities.  No fevers but patient notes chills and cold sweats.  Patient is a 41 y.o. female presenting with abdominal pain. The history is provided by the patient. No language interpreter was used.  Abdominal Pain The primary symptoms of the illness include abdominal pain, fatigue, nausea and vomiting. The primary symptoms of the illness do not include fever, shortness of breath, diarrhea, hematemesis, hematochezia, dysuria or vaginal discharge. The current episode started more than 2 days ago. The onset of the illness was sudden. The problem has not changed since onset. Additional symptoms associated with the illness include chills and diaphoresis. Symptoms associated with the illness do not include back pain.    Past Medical History  Diagnosis Date  . Bipolar 1 disorder   . PTSD (post-traumatic stress disorder)   . ADHD (attention deficit hyperactivity disorder)   . Schizophrenia     Past Surgical  History  Procedure Date  . Ganglion cyst excision     History reviewed. No pertinent family history.  History  Substance Use Topics  . Smoking status: Never Smoker   . Smokeless tobacco: Not on file  . Alcohol Use: No    OB History    Grav Para Term Preterm Abortions TAB SAB Ect Mult Living                  Review of Systems  Constitutional: Positive for chills, diaphoresis, appetite change and fatigue. Negative for fever.  HENT: Negative.   Eyes: Negative.  Negative for discharge and redness.  Respiratory: Negative.  Negative for cough and shortness of breath.   Cardiovascular: Negative.  Negative for chest pain.  Gastrointestinal: Positive for nausea, vomiting and abdominal pain. Negative for diarrhea, hematochezia and hematemesis.  Genitourinary: Negative.  Negative for dysuria and vaginal discharge.  Musculoskeletal: Negative.  Negative for back pain.  Skin: Negative.  Negative for color change and rash.  Neurological: Negative.  Negative for syncope and headaches.  Hematological: Negative.  Negative for adenopathy.  Psychiatric/Behavioral: Negative.  Negative for confusion.  All other systems reviewed and are negative.    Allergies  Review of patient's allergies indicates no known allergies.  Home Medications   Current Outpatient Rx  Name Route Sig Dispense Refill  . ARIPIPRAZOLE 15 MG PO TABS Oral Take 15 mg by mouth daily.    Marland Kitchen DICYCLOMINE HCL 10 MG PO CAPS Oral Take 10 mg by mouth 3 (three) times daily between meals as needed. For stomach spasms     .  HYDROCODONE-ACETAMINOPHEN 5-500 MG PO TABS Oral Take 1 tablet by mouth every 6 (six) hours as needed.    Marland Kitchen LAMOTRIGINE 100 MG PO TABS Oral Take 100 mg by mouth daily.    Marland Kitchen LISDEXAMFETAMINE DIMESYLATE 50 MG PO CAPS Oral Take 50 mg by mouth every morning.    Marland Kitchen OMEPRAZOLE 20 MG PO CPDR Oral Take 20 mg by mouth daily.      Marland Kitchen ONDANSETRON 8 MG PO TBDP Oral Take 1 tablet (8 mg total) by mouth every 8 (eight) hours as  needed for nausea. 20 tablet 0  . PROMETHAZINE HCL 25 MG RE SUPP Rectal Place 1 suppository (25 mg total) rectally every 6 (six) hours as needed for nausea. 12 each 0  . PROMETHAZINE HCL 25 MG PO TABS Oral Take 25 mg by mouth every 6 (six) hours as needed.    . SERTRALINE HCL 100 MG PO TABS Oral Take 150 mg by mouth daily.     . TRAMADOL HCL 50 MG PO TABS Oral Take 50 mg by mouth 2 (two) times daily as needed.    . TRAZODONE HCL 100 MG PO TABS Oral Take 50 mg by mouth at bedtime.       BP 158/87  Pulse 75  Temp 98.3 F (36.8 C)  Resp 18  SpO2 100%  LMP 12/22/2011  Physical Exam  Nursing note and vitals reviewed. Constitutional: She is oriented to person, place, and time. She appears well-developed and well-nourished.  Non-toxic appearance. She does not have a sickly appearance.  HENT:  Head: Normocephalic and atraumatic.  Eyes: Conjunctivae, EOM and lids are normal. Pupils are equal, round, and reactive to light. No scleral icterus.  Neck: Trachea normal and normal range of motion. Neck supple.  Cardiovascular: Normal rate, regular rhythm and normal heart sounds.   Pulmonary/Chest: Effort normal and breath sounds normal. No respiratory distress. She has no wheezes. She has no rales.  Abdominal: Soft. Normal appearance. She exhibits no distension. There is no tenderness. There is no rebound, no guarding and no CVA tenderness.  Musculoskeletal: Normal range of motion.  Neurological: She is alert and oriented to person, place, and time. She has normal strength.  Skin: Skin is warm, dry and intact. No rash noted.  Psychiatric: She has a normal mood and affect. Her behavior is normal. Judgment and thought content normal.    ED Course  Procedures (including critical care time)  Results for orders placed during the hospital encounter of 01/01/12  CBC      Component Value Range   WBC 11.3 (*) 4.0 - 10.5 (K/uL)   RBC 3.91  3.87 - 5.11 (MIL/uL)   Hemoglobin 9.2 (*) 12.0 - 15.0 (g/dL)    HCT 45.4 (*) 09.8 - 46.0 (%)   MCV 74.2 (*) 78.0 - 100.0 (fL)   MCH 23.5 (*) 26.0 - 34.0 (pg)   MCHC 31.7  30.0 - 36.0 (g/dL)   RDW 11.9 (*) 14.7 - 15.5 (%)   Platelets 189  150 - 400 (K/uL)  DIFFERENTIAL      Component Value Range   Neutrophils Relative 70  43 - 77 (%)   Neutro Abs 7.9 (*) 1.7 - 7.7 (K/uL)   Lymphocytes Relative 20  12 - 46 (%)   Lymphs Abs 2.2  0.7 - 4.0 (K/uL)   Monocytes Relative 10  3 - 12 (%)   Monocytes Absolute 1.2 (*) 0.1 - 1.0 (K/uL)   Eosinophils Relative 0  0 - 5 (%)  Eosinophils Absolute 0.0  0.0 - 0.7 (K/uL)   Basophils Relative 0  0 - 1 (%)   Basophils Absolute 0.0  0.0 - 0.1 (K/uL)  COMPREHENSIVE METABOLIC PANEL      Component Value Range   Sodium 135  135 - 145 (mEq/L)   Potassium 3.7  3.5 - 5.1 (mEq/L)   Chloride 96  96 - 112 (mEq/L)   CO2 30  19 - 32 (mEq/L)   Glucose, Bld 100 (*) 70 - 99 (mg/dL)   BUN 14  6 - 23 (mg/dL)   Creatinine, Ser 3.08  0.50 - 1.10 (mg/dL)   Calcium 65.7 (*) 8.4 - 10.5 (mg/dL)   Total Protein 8.0  6.0 - 8.3 (g/dL)   Albumin 4.5  3.5 - 5.2 (g/dL)   AST 14  0 - 37 (U/L)   ALT 13  0 - 35 (U/L)   Alkaline Phosphatase 76  39 - 117 (U/L)   Total Bilirubin 0.3  0.3 - 1.2 (mg/dL)   GFR calc non Af Amer >90  >90 (mL/min)   GFR calc Af Amer >90  >90 (mL/min)  LIPASE, BLOOD      Component Value Range   Lipase 60 (*) 11 - 59 (U/L)  URINALYSIS, ROUTINE W REFLEX MICROSCOPIC      Component Value Range   Color, Urine YELLOW  YELLOW    APPearance CLOUDY (*) CLEAR    Specific Gravity, Urine 1.015  1.005 - 1.030    pH 7.0  5.0 - 8.0    Glucose, UA NEGATIVE  NEGATIVE (mg/dL)   Hgb urine dipstick NEGATIVE  NEGATIVE    Bilirubin Urine NEGATIVE  NEGATIVE    Ketones, ur NEGATIVE  NEGATIVE (mg/dL)   Protein, ur NEGATIVE  NEGATIVE (mg/dL)   Urobilinogen, UA 0.2  0.0 - 1.0 (mg/dL)   Nitrite NEGATIVE  NEGATIVE    Leukocytes, UA NEGATIVE  NEGATIVE   PREGNANCY, URINE      Component Value Range   Preg Test, Ur NEGATIVE  NEGATIVE     Dg Chest 2 View  12/29/2011  *RADIOLOGY REPORT*  Clinical Data: cough  CHEST - 2 VIEW  Comparison: None.  Findings: The heart size and mediastinal contours are within normal limits.  Both lungs are clear.  The visualized skeletal structures are unremarkable.  IMPRESSION: Negative exam.  Original Report Authenticated By: Rosealee Albee, M.D.   US Abdomen Complete  01/01/2012  *RADIOLOGY REPORT*  Clinical Data:  Nausea, vomiting.  COMPLETE ABDOMINAL ULTRASOUND  Comparison:  None.  Findings:  Gallbladder:  Echogenic nondependent nonshadowing focus in the gallbladder neck, likely small polyp, 5 mm.  No visible stones.  No wall thickening or sonographic Murphy's sign.  Common bile duct:   Normal caliber, 2 mm.  Liver:  No focal lesion identified.  Within normal limits in parenchymal echogenicity.  IVC:  Appears normal.  Pancreas:  No focal abnormality seen.  Spleen:  Within normal limits in size and echotexture.  Right Kidney:   Normal in size and parenchymal echogenicity.  No evidence of mass or hydronephrosis.  Left Kidney:  Normal in size and parenchymal echogenicity.  No evidence of mass or hydronephrosis.  Abdominal aorta:  No aneurysm identified.  IMPRESSION: Small gallbladder wall polyp within the gallbladder neck.  No stones or evidence of acute cholecystitis.  Original Report Authenticated By: Cyndie Chime, M.D.      MDM  Patient with persistent nausea and vomiting since Friday.  She notes epigastric pain as well.  I will obtain a CMP and lipase to assess for signs of hepatitis, pancreatitis or cholecystitis.  Patient has a benign abdomen on exam and does not appear to have an acute abdomen.  I will check urinalysis for UTI and a pregnancy test.  Patient will receive IV fluids, Zofran and fentanyl for pain and nausea.        Nat Christen, MD 01/01/12 [phone]  Patient with no acute auditory or ultrasound abnormalities today.  Her abdomen is soft on exam.  I do not feel she is an  acute surgical abdomen.  She shows no signs of cholecystitis or symptomatic cholelithiasis.  Her lipase is a very mild elevation which is likely more related to her intermittent vomiting but not to true pancreatitis.  At this point in time the patient may have peptic ulcer disease and I have attempted a GI cocktail to try and treat her symptoms.  Patient is now tolerating some by mouth fluids here in the department and does feel improved.  I would recommend outpatient followup with the GI specialist and give her the [facility] information.  Nat Christen, MD 01/01/12 [phone]

## 2012-08-06 ENCOUNTER — Emergency Department (HOSPITAL_BASED_OUTPATIENT_CLINIC_OR_DEPARTMENT_OTHER)
Admission: EM | Admit: 2012-08-06 | Discharge: 2012-08-06 | Disposition: A | Discharge status: Home / Self Care | Payer: Medicaid Other | Attending: Emergency Medicine | Admitting: Emergency Medicine

## 2012-08-06 ENCOUNTER — Encounter (HOSPITAL_BASED_OUTPATIENT_CLINIC_OR_DEPARTMENT_OTHER): Payer: Self-pay | Admitting: Emergency Medicine

## 2012-08-06 DIAGNOSIS — Z79899 Other long term (current) drug therapy: Secondary | ICD-10-CM | POA: Insufficient documentation

## 2012-08-06 DIAGNOSIS — F4310 Post-traumatic stress disorder, unspecified: Secondary | ICD-10-CM | POA: Insufficient documentation

## 2012-08-06 DIAGNOSIS — B3731 Acute candidiasis of vulva and vagina: Secondary | ICD-10-CM | POA: Insufficient documentation

## 2012-08-06 DIAGNOSIS — F410 Panic disorder [episodic paroxysmal anxiety] without agoraphobia: Secondary | ICD-10-CM | POA: Insufficient documentation

## 2012-08-06 DIAGNOSIS — B359 Dermatophytosis, unspecified: Secondary | ICD-10-CM | POA: Insufficient documentation

## 2012-08-06 DIAGNOSIS — F429 Obsessive-compulsive disorder, unspecified: Secondary | ICD-10-CM | POA: Insufficient documentation

## 2012-08-06 DIAGNOSIS — F209 Schizophrenia, unspecified: Secondary | ICD-10-CM | POA: Insufficient documentation

## 2012-08-06 DIAGNOSIS — F3160 Bipolar disorder, current episode mixed, unspecified: Secondary | ICD-10-CM | POA: Insufficient documentation

## 2012-08-06 DIAGNOSIS — B373 Candidiasis of vulva and vagina: Secondary | ICD-10-CM | POA: Insufficient documentation

## 2012-08-06 DIAGNOSIS — F909 Attention-deficit hyperactivity disorder, unspecified type: Secondary | ICD-10-CM | POA: Insufficient documentation

## 2012-08-06 HISTORY — DX: Anemia, unspecified: D64.9

## 2012-08-06 LAB — URINALYSIS, ROUTINE W REFLEX MICROSCOPIC
Bilirubin Urine: NEGATIVE
Glucose, UA: NEGATIVE mg/dL
Ketones, ur: NEGATIVE mg/dL
Protein, ur: NEGATIVE mg/dL
Urobilinogen, UA: 0.2 mg/dL (ref 0.0–1.0)

## 2012-08-06 LAB — WET PREP, GENITAL
Trich, Wet Prep: NONE SEEN
Yeast Wet Prep HPF POC: NONE SEEN

## 2012-08-06 LAB — URINE MICROSCOPIC-ADD ON

## 2012-08-06 LAB — HIV ANTIBODY (ROUTINE TESTING W REFLEX): HIV: NONREACTIVE

## 2012-08-06 LAB — PREGNANCY, URINE: Preg Test, Ur: NEGATIVE

## 2012-08-06 MED ORDER — CLOTRIMAZOLE 1 % EX CREA: TOPICAL_CREAM | CUTANEOUS | Status: DC

## 2012-08-06 MED ORDER — FLUCONAZOLE 150 MG PO TABS: 150.0000 mg | ORAL_TABLET | Freq: Every day | ORAL | Status: AC

## 2012-08-06 NOTE — ED Notes (Signed)
Pt c/o vaginal itching and burning for several days. Pt also c/ rash on Lt foot.

## 2012-08-06 NOTE — ED Provider Notes (Signed)
History     CSN: [id number]  Arrival date & time 08/06/12  0845   None     No chief complaint on file.   (Consider location/radiation/quality/duration/timing/severity/associated sxs/prior treatment) The history is provided by the patient.   41 year old female had an episode of unprotected sex about one week ago, and the next day, and noted that she was having some vaginal burning and itching. There's not been any discharge she was aware of. She denies any urinary urgency, frequency, tenesmus, dysuria. Denies any abdominal pain, nausea, vomiting. She's concerned that she may have contracted an STD and wishes to be checked out. There was oral sex involved as well and she wanted her throat swabbed. Separate complaint, she has had some itching on her left foot for the last 3 weeks which is getting worse.  Past Medical History  Diagnosis Date  . Bipolar 1 disorder   . PTSD (post-traumatic stress disorder)   . ADHD (attention deficit hyperactivity disorder)   . Schizophrenia   . OCD (obsessive compulsive disorder)   . Panic     Past Surgical History  Procedure Date  . Ganglion cyst excision     No family history on file.  History  Substance Use Topics  . Smoking status: Never Smoker   . Smokeless tobacco: Not on file  . Alcohol Use: No    OB History    Grav Para Term Preterm Abortions TAB SAB Ect Mult Living                  Review of Systems  All other systems reviewed and are negative.    Allergies  Review of patient's allergies indicates no known allergies.  Home Medications   Current Outpatient Rx  Name  Route  Sig  Dispense  Refill  . ARIPIPRAZOLE 15 MG PO TABS   Oral   Take 15 mg by mouth daily.         Marland Kitchen DICYCLOMINE HCL 10 MG PO CAPS   Oral   Take 10 mg by mouth 3 (three) times daily between meals as needed. For stomach spasms          . HYDROCODONE-ACETAMINOPHEN 5-500 MG PO TABS   Oral   Take 1 tablet by mouth every 6 (six) hours as needed.         Marland Kitchen LAMOTRIGINE 100 MG PO TABS   Oral   Take 100 mg by mouth daily.         Marland Kitchen LISDEXAMFETAMINE DIMESYLATE 50 MG PO CAPS   Oral   Take 50 mg by mouth every morning.         Marland Kitchen OMEPRAZOLE 20 MG PO CPDR   Oral   Take 20 mg by mouth daily.           Marland Kitchen OMEPRAZOLE 20 MG PO CPDR   Oral   Take 1 capsule (20 mg total) by mouth daily.   30 capsule   0   . PROMETHAZINE HCL 25 MG RE SUPP   Rectal   Place 1 suppository (25 mg total) rectally every 6 (six) hours as needed for nausea.   12 each   0   . PROMETHAZINE HCL 25 MG PO TABS   Oral   Take 25 mg by mouth every 6 (six) hours as needed.         . SERTRALINE HCL 100 MG PO TABS   Oral   Take 150 mg by mouth daily.          Marland Kitchen  TRAMADOL HCL 50 MG PO TABS   Oral   Take 50 mg by mouth 2 (two) times daily as needed.         . TRAZODONE HCL 100 MG PO TABS   Oral   Take 50 mg by mouth at bedtime.            There were no vitals taken for this visit.  Physical Exam  Nursing note and vitals reviewed. 41 year old female, resting comfortably and in no acute distress. Vital signs are normal. Oxygen saturation is 100%, which is normal. Head is normocephalic and atraumatic. PERRLA, EOMI. Oropharynx is clear. Neck is nontender and supple without adenopathy or JVD. Back is nontender and there is no CVA tenderness. Lungs are clear without rales, wheezes, or rhonchi. Chest is nontender. Heart has regular rate and rhythm without murmur. Abdomen is soft, flat, nontender without masses or hepatosplenomegaly and peristalsis is normoactive. Pelvic: Normal external female genitalia, small to moderate amount of blood in the vaginal vault consistent with menstrual flow, cervix appears normal, fundus is enlarged to 12-14 week size, there no adnexal masses or tenderness, there is no cervical motion tenderness. Extremities have no cyanosis or edema, full range of motion is present. Skin is warm and dry. There is discoloration and  thickening of the skin of the dorsum and dorsolateral aspect of the left foot consistent with tinea corporis. Neurologic: Mental status is normal, cranial nerves are intact, there are no motor or sensory deficits.   ED Course  Procedures (including critical care time)  Results for orders placed during the hospital encounter of 08/06/12  URINALYSIS, ROUTINE W REFLEX MICROSCOPIC      Component Value Range   Color, Urine YELLOW  YELLOW   APPearance CLOUDY (*) CLEAR   Specific Gravity, Urine 1.027  1.005 - 1.030   pH 5.0  5.0 - 8.0   Glucose, UA NEGATIVE  NEGATIVE mg/dL   Hgb urine dipstick LARGE (*) NEGATIVE   Bilirubin Urine NEGATIVE  NEGATIVE   Ketones, ur NEGATIVE  NEGATIVE mg/dL   Protein, ur NEGATIVE  NEGATIVE mg/dL   Urobilinogen, UA 0.2  0.0 - 1.0 mg/dL   Nitrite NEGATIVE  NEGATIVE   Leukocytes, UA NEGATIVE  NEGATIVE  PREGNANCY, URINE      Component Value Range   Preg Test, Ur NEGATIVE  NEGATIVE  WET PREP, GENITAL      Component Value Range   Yeast Wet Prep HPF POC NONE SEEN  NONE SEEN   Trich, Wet Prep NONE SEEN  NONE SEEN   Clue Cells Wet Prep HPF POC FEW (*) NONE SEEN   WBC, Wet Prep HPF POC FEW (*) NONE SEEN  URINE MICROSCOPIC-ADD ON      Component Value Range   Squamous Epithelial / LPF FEW (*) RARE   WBC, UA 3-6  <3 WBC/hpf   RBC / HPF TOO NUMEROUS TO COUNT  <3 RBC/hpf   Bacteria, UA MANY (*) RARE   Urine-Other RARE YEAST       1. Monilial vaginitis   2. Tinea       MDM  Episode of unprotected sex without definite evidence of STD. Specimens were sent for cultures and she is offered prophylactic treatment for STD which he has declined. Foot rash seems most likely to be fungal and will be treated with topical antifungal agents.  No yeast were seen on wet prep, but they were seen on urinalysis. She'll be treated for monilial vaginitis as this does seem  to fit her clinical presentation.     Dione Booze, MD 08/06/12 1012

## 2012-08-07 LAB — URINE CULTURE

## 2012-08-07 LAB — GC/CHLAMYDIA PROBE AMP: GC Probe RNA: NEGATIVE

## 2013-01-04 ENCOUNTER — Emergency Department (HOSPITAL_BASED_OUTPATIENT_CLINIC_OR_DEPARTMENT_OTHER)
Admission: EM | Admit: 2013-01-04 | Discharge: 2013-01-04 | Disposition: A | Discharge status: Home / Self Care | Payer: Medicaid Other | Attending: Emergency Medicine | Admitting: Emergency Medicine

## 2013-01-04 ENCOUNTER — Encounter (HOSPITAL_BASED_OUTPATIENT_CLINIC_OR_DEPARTMENT_OTHER): Payer: Self-pay | Admitting: Emergency Medicine

## 2013-01-04 DIAGNOSIS — F429 Obsessive-compulsive disorder, unspecified: Secondary | ICD-10-CM | POA: Insufficient documentation

## 2013-01-04 DIAGNOSIS — F209 Schizophrenia, unspecified: Secondary | ICD-10-CM | POA: Insufficient documentation

## 2013-01-04 DIAGNOSIS — D649 Anemia, unspecified: Secondary | ICD-10-CM | POA: Insufficient documentation

## 2013-01-04 DIAGNOSIS — Z3202 Encounter for pregnancy test, result negative: Secondary | ICD-10-CM | POA: Insufficient documentation

## 2013-01-04 DIAGNOSIS — F4310 Post-traumatic stress disorder, unspecified: Secondary | ICD-10-CM | POA: Insufficient documentation

## 2013-01-04 DIAGNOSIS — F909 Attention-deficit hyperactivity disorder, unspecified type: Secondary | ICD-10-CM | POA: Insufficient documentation

## 2013-01-04 DIAGNOSIS — Z79899 Other long term (current) drug therapy: Secondary | ICD-10-CM | POA: Insufficient documentation

## 2013-01-04 DIAGNOSIS — K5289 Other specified noninfective gastroenteritis and colitis: Secondary | ICD-10-CM | POA: Insufficient documentation

## 2013-01-04 DIAGNOSIS — R112 Nausea with vomiting, unspecified: Secondary | ICD-10-CM | POA: Insufficient documentation

## 2013-01-04 DIAGNOSIS — R079 Chest pain, unspecified: Secondary | ICD-10-CM | POA: Insufficient documentation

## 2013-01-04 DIAGNOSIS — Z87891 Personal history of nicotine dependence: Secondary | ICD-10-CM | POA: Insufficient documentation

## 2013-01-04 DIAGNOSIS — K529 Noninfective gastroenteritis and colitis, unspecified: Secondary | ICD-10-CM

## 2013-01-04 DIAGNOSIS — F319 Bipolar disorder, unspecified: Secondary | ICD-10-CM | POA: Insufficient documentation

## 2013-01-04 LAB — COMPREHENSIVE METABOLIC PANEL
CO2: 22 mEq/L (ref 19–32)
Calcium: 10.2 mg/dL (ref 8.4–10.5)
Creatinine, Ser: 0.6 mg/dL (ref 0.50–1.10)
GFR calc Af Amer: >90 mL/min (ref >90)
GFR calc non Af Amer: >90 mL/min (ref >90)
Glucose, Bld: 142 mg/dL — ABNORMAL HIGH (ref 70–99)

## 2013-01-04 LAB — CBC WITH DIFFERENTIAL/PLATELET
Eosinophils Relative: 0 % (ref 0–5)
HCT: 33.5 % — ABNORMAL LOW (ref 36.0–46.0)
Lymphs Abs: 0.8 10*3/uL (ref 0.7–4.0)
MCH: 23.9 pg — ABNORMAL LOW (ref 26.0–34.0)
MCV: 74.9 fL — ABNORMAL LOW (ref 78.0–100.0)
Monocytes Relative: 2 % — ABNORMAL LOW (ref 3–12)
Neutro Abs: 10.5 10*3/uL — ABNORMAL HIGH (ref 1.7–7.7)
RBC: 4.47 MIL/uL (ref 3.87–5.11)
RDW: 23.7 % — ABNORMAL HIGH (ref 11.5–15.5)
WBC: 11.5 10*3/uL — ABNORMAL HIGH (ref 4.0–10.5)

## 2013-01-04 LAB — URINALYSIS, ROUTINE W REFLEX MICROSCOPIC
Bilirubin Urine: NEGATIVE
Glucose, UA: NEGATIVE mg/dL
Hgb urine dipstick: NEGATIVE
Protein, ur: 30 mg/dL — AB

## 2013-01-04 LAB — URINE MICROSCOPIC-ADD ON

## 2013-01-04 LAB — LIPASE, BLOOD: Lipase: 19 U/L (ref 11–59)

## 2013-01-04 MED ORDER — ONDANSETRON HCL 4 MG PO TABS: 4.0000 mg | ORAL_TABLET | Freq: Three times a day (TID) | ORAL | Status: DC | PRN

## 2013-01-04 MED ORDER — ONDANSETRON HCL 4 MG/2ML IJ SOLN
4.0000 mg | Freq: Once | INTRAMUSCULAR | Status: AC
  Administered 2013-01-04: 4 mg via INTRAVENOUS
  Filled 2013-01-04: qty 2

## 2013-01-04 MED ORDER — SODIUM CHLORIDE 0.9 % IV BOLUS (SEPSIS)
1000.0000 mL | Freq: Once | INTRAVENOUS | Status: AC
Start: 2013-01-04 — End: 2013-01-04
  Administered 2013-01-04: 1000 mL via INTRAVENOUS

## 2013-01-04 MED ORDER — HYDROMORPHONE HCL PF 1 MG/ML IJ SOLN
1.0000 mg | Freq: Once | INTRAMUSCULAR | Status: AC
  Administered 2013-01-04: 1 mg via INTRAVENOUS
  Filled 2013-01-04: qty 1

## 2013-01-04 MED ORDER — OXYCODONE-ACETAMINOPHEN 5-325 MG PO TABS: 1.0000 | ORAL_TABLET | Freq: Four times a day (QID) | ORAL | Status: DC | PRN

## 2013-01-04 MED ORDER — PANTOPRAZOLE SODIUM 40 MG IV SOLR
40.0000 mg | Freq: Once | INTRAVENOUS | Status: AC
  Administered 2013-01-04: 40 mg via INTRAVENOUS
  Filled 2013-01-04: qty 40

## 2013-01-04 NOTE — ED Provider Notes (Signed)
History     CSN: [id number]  Arrival date & time 01/04/13  2140   First MD Initiated Contact with Patient 01/04/13 2151      Chief Complaint  Patient presents with  . Emesis  . Abdominal Pain    (Consider location/radiation/quality/duration/timing/severity/associated sxs/prior treatment) HPI  Past Medical History  Diagnosis Date  . Bipolar 1 disorder   . PTSD (post-traumatic stress disorder)   . ADHD (attention deficit hyperactivity disorder)   . Schizophrenia   . OCD (obsessive compulsive disorder)   . Panic   . Anemia     Past Surgical History  Procedure Laterality Date  . Ganglion cyst excision      No family history on file.  History  Substance Use Topics  . Smoking status: Former Games developer  . Smokeless tobacco: Not on file  . Alcohol Use: Yes     Comment: occasionally    OB History   Grav Para Term Preterm Abortions TAB SAB Ect Mult Living                  Review of Systems  Allergies  Review of patient's allergies indicates no known allergies.  Home Medications   Current Outpatient Rx  Name  Route  Sig  Dispense  Refill  . ferrous sulfate 325 (65 FE) MG tablet   Oral   Take 325 mg by mouth daily with breakfast.         . lamoTRIgine (LAMICTAL) 100 MG tablet   Oral   Take 100 mg by mouth daily.         Marland Kitchen lisdexamfetamine (VYVANSE) 50 MG capsule   Oral   Take 50 mg by mouth every morning.         . ARIPiprazole (ABILIFY) 15 MG tablet   Oral   Take 15 mg by mouth daily.         . clotrimazole (LOTRIMIN) 1 % cream      Apply to affected area 2 times daily   60 g   0   . dicyclomine (BENTYL) 10 MG capsule   Oral   Take 10 mg by mouth 3 (three) times daily between meals as needed. For stomach spasms          . HYDROcodone-acetaminophen (VICODIN) 5-500 MG per tablet   Oral   Take 1 tablet by mouth every 6 (six) hours as needed.         Marland Kitchen EXPIRED: omeprazole (PRILOSEC) 20 MG capsule   Oral   Take 20 mg by mouth daily.            Marland Kitchen EXPIRED: omeprazole (PRILOSEC) 20 MG capsule   Oral   Take 1 capsule (20 mg total) by mouth daily.   30 capsule   0   . Ondansetron HCl (ZOFRAN IV)   Intravenous   Inject 4 mg into the vein.         Marland Kitchen EXPIRED: promethazine (PHENERGAN) 25 MG suppository   Rectal   Place 1 suppository (25 mg total) rectally every 6 (six) hours as needed for nausea.   12 each   0   . promethazine (PHENERGAN) 25 MG tablet   Oral   Take 25 mg by mouth every 6 (six) hours as needed.         . sertraline (ZOLOFT) 100 MG tablet   Oral   Take 150 mg by mouth daily.          . traMADol (ULTRAM) 50 MG tablet  Oral   Take 50 mg by mouth 2 (two) times daily as needed.         . traZODone (DESYREL) 100 MG tablet   Oral   Take 50 mg by mouth at bedtime.            BP 188/99  Pulse 89  Temp(Src) 98.3 F (36.8 C) (Oral)  Resp 18  Ht 6' (1.829 m)  Wt 175 lb (79.379 kg)  BMI 23.73 kg/m2  SpO2 100%  Physical Exam  ED Course  Procedures (including critical care time)  Labs Reviewed  URINALYSIS, ROUTINE W REFLEX MICROSCOPIC - Abnormal; Notable for the following:    APPearance TURBID (*)    Ketones, ur 15 (*)    Protein, ur 30 (*)    All other components within normal limits  CBC WITH DIFFERENTIAL - Abnormal; Notable for the following:    WBC 11.5 (*)    Hemoglobin 10.7 (*)    HCT 33.5 (*)    MCV 74.9 (*)    MCH 23.9 (*)    RDW 23.7 (*)    Neutrophils Relative 91 (*)    Lymphocytes Relative 7 (*)    Monocytes Relative 2 (*)    Neutro Abs 10.5 (*)    All other components within normal limits  COMPREHENSIVE METABOLIC PANEL - Abnormal; Notable for the following:    Potassium 3.3 (*)    Glucose, Bld 142 (*)    Total Protein 8.5 (*)    Total Bilirubin 0.2 (*)    All other components within normal limits  URINE MICROSCOPIC-ADD ON - Abnormal; Notable for the following:    Squamous Epithelial / LPF FEW (*)    All other components within normal limits  PREGNANCY,  URINE  LIPASE, BLOOD   No results found.   1. Gastroenteritis       MDM  Labs reviewed, no significant abnormalities. Feeling better, pain improved. Will attempt PO trial.         Leonette Most B. Bernette Mayers, MD 01/04/13 2316

## 2013-01-04 NOTE — ED Provider Notes (Signed)
History  This chart was scribed for Sara Yates B. Bernette Mayers, MD by Ardeen Jourdain, ED Scribe. This patient was seen in room MH08/MH08 and the patient's care was started at 2151.  CSN: [id number]  Arrival date & time 01/04/13  2140   First MD Initiated Contact with Patient 01/04/13 2151      Chief Complaint  Patient presents with  . Emesis  . Abdominal Pain     The history is provided by the patient. No language interpreter was used.    Sara Yates is a 42 y.o. female brought in by ambulance, who presents to the Emergency Department complaining of sudden onset, gradually worsening, constant emesis with associated nausea and CP. She states she ate [place] food 8 hours ago. She states she began feeling nauseous less than an hour after eating the food. She states she has had several episodes of emesis since onset. She denies any fever or diarrhea.  She denies any blood in her stool or vomit.   Past Medical History  Diagnosis Date  . Bipolar 1 disorder   . PTSD (post-traumatic stress disorder)   . ADHD (attention deficit hyperactivity disorder)   . Schizophrenia   . OCD (obsessive compulsive disorder)   . Panic   . Anemia     Past Surgical History  Procedure Laterality Date  . Ganglion cyst excision      No family history on file.  History  Substance Use Topics  . Smoking status: Former Games developer  . Smokeless tobacco: Not on file  . Alcohol Use: Yes     Comment: occasionally   No OB history available.   Review of Systems  A complete 10 system review of systems was obtained and all systems are negative except as noted in the HPI and PMH.    Allergies  Review of patient's allergies indicates no known allergies.  Home Medications   Current Outpatient Rx  Name  Route  Sig  Dispense  Refill  . ferrous sulfate 325 (65 FE) MG tablet   Oral   Take 325 mg by mouth daily with breakfast.         . lamoTRIgine (LAMICTAL) 100 MG tablet   Oral   Take 100 mg by mouth  daily.         Marland Kitchen lisdexamfetamine (VYVANSE) 50 MG capsule   Oral   Take 50 mg by mouth every morning.         . ARIPiprazole (ABILIFY) 15 MG tablet   Oral   Take 15 mg by mouth daily.         . clotrimazole (LOTRIMIN) 1 % cream      Apply to affected area 2 times daily   60 g   0   . dicyclomine (BENTYL) 10 MG capsule   Oral   Take 10 mg by mouth 3 (three) times daily between meals as needed. For stomach spasms          . HYDROcodone-acetaminophen (VICODIN) 5-500 MG per tablet   Oral   Take 1 tablet by mouth every 6 (six) hours as needed.         Marland Kitchen EXPIRED: omeprazole (PRILOSEC) 20 MG capsule   Oral   Take 20 mg by mouth daily.           Marland Kitchen EXPIRED: omeprazole (PRILOSEC) 20 MG capsule   Oral   Take 1 capsule (20 mg total) by mouth daily.   30 capsule   0   . Ondansetron HCl (ZOFRAN  IV)   Intravenous   Inject 4 mg into the vein.         Marland Kitchen EXPIRED: promethazine (PHENERGAN) 25 MG suppository   Rectal   Place 1 suppository (25 mg total) rectally every 6 (six) hours as needed for nausea.   12 each   0   . promethazine (PHENERGAN) 25 MG tablet   Oral   Take 25 mg by mouth every 6 (six) hours as needed.         . sertraline (ZOLOFT) 100 MG tablet   Oral   Take 150 mg by mouth daily.          . traMADol (ULTRAM) 50 MG tablet   Oral   Take 50 mg by mouth 2 (two) times daily as needed.         . traZODone (DESYREL) 100 MG tablet   Oral   Take 50 mg by mouth at bedtime.            Triage Vitals: BP 188/99  Pulse 89  Temp(Src) 98.3 F (36.8 C) (Oral)  Resp 18  Ht 6' (1.829 m)  Wt 175 lb (79.379 kg)  BMI 23.73 kg/m2  SpO2 100%  Physical Exam  Nursing note and vitals reviewed. Constitutional: She is oriented to person, place, and time. She appears well-developed and well-nourished. No distress.  HENT:  Head: Normocephalic and atraumatic.  Eyes: EOM are normal. Pupils are equal, round, and reactive to light.  Neck: Normal range of  motion. Neck supple.  Cardiovascular: Normal rate, normal heart sounds and intact distal pulses.   Pulmonary/Chest: Effort normal and breath sounds normal.  Abdominal: Soft. Bowel sounds are normal. She exhibits no distension and no mass. There is tenderness. There is no rebound and no guarding.  Diffusely tender   Musculoskeletal: Normal range of motion. She exhibits no edema and no tenderness.  Neurological: She is alert and oriented to person, place, and time. She has normal strength. No cranial nerve deficit or sensory deficit.  Skin: Skin is warm and dry. No rash noted.  Psychiatric: She has a normal mood and affect.    ED Course  Procedures (including critical care time)  DIAGNOSTIC STUDIES: Oxygen Saturation is 100% on room air, normal by my interpretation.    COORDINATION OF CARE:  10:00 PM-Discussed treatment plan which includes anti-nausea medication, CBC, CMP, lipase, UA and pregnancy test with pt at bedside and pt agreed to plan.    Labs Reviewed  URINALYSIS, ROUTINE W REFLEX MICROSCOPIC - Abnormal; Notable for the following:    APPearance TURBID (*)    Ketones, ur 15 (*)    Protein, ur 30 (*)    All other components within normal limits  CBC WITH DIFFERENTIAL - Abnormal; Notable for the following:    WBC 11.5 (*)    Hemoglobin 10.7 (*)    HCT 33.5 (*)    MCV 74.9 (*)    MCH 23.9 (*)    RDW 23.7 (*)    Neutrophils Relative 91 (*)    Lymphocytes Relative 7 (*)    Monocytes Relative 2 (*)    Neutro Abs 10.5 (*)    All other components within normal limits  COMPREHENSIVE METABOLIC PANEL - Abnormal; Notable for the following:    Potassium 3.3 (*)    Glucose, Bld 142 (*)    Total Protein 8.5 (*)    Total Bilirubin 0.2 (*)    All other components within normal limits  URINE MICROSCOPIC-ADD ON - Abnormal;  Notable for the following:    Squamous Epithelial / LPF FEW (*)    All other components within normal limits  PREGNANCY, URINE  LIPASE, BLOOD   No results  found.   1. Gastroenteritis       MDM  Labs reviewed. Feeling better after meds. Ready to go home. Given Rx for pain and nausea meds.       I personally performed the services described in this documentation, which was scribed in my presence. The recorded information has been reviewed and is accurate.     Courtny Bennison B. Bernette Mayers, MD 01/18/13 1402

## 2013-01-04 NOTE — ED Notes (Signed)
Pt ate chinese food about 2pm today and then an hour later started feeling nauseous.  Has had several bouts of v+d since.

## 2013-02-12 ENCOUNTER — Encounter (HOSPITAL_BASED_OUTPATIENT_CLINIC_OR_DEPARTMENT_OTHER): Payer: Self-pay | Admitting: Emergency Medicine

## 2013-02-12 ENCOUNTER — Emergency Department (HOSPITAL_BASED_OUTPATIENT_CLINIC_OR_DEPARTMENT_OTHER)
Admission: EM | Admit: 2013-02-12 | Discharge: 2013-02-12 | Disposition: A | Discharge status: Home / Self Care | Payer: Medicaid Other | Source: Home / Self Care | Attending: Emergency Medicine | Admitting: Emergency Medicine

## 2013-02-12 ENCOUNTER — Emergency Department (HOSPITAL_BASED_OUTPATIENT_CLINIC_OR_DEPARTMENT_OTHER)
Admission: EM | Admit: 2013-02-12 | Discharge: 2013-02-12 | Disposition: A | Discharge status: Home / Self Care | Payer: Medicaid Other | Attending: Emergency Medicine | Admitting: Emergency Medicine

## 2013-02-12 ENCOUNTER — Encounter (HOSPITAL_BASED_OUTPATIENT_CLINIC_OR_DEPARTMENT_OTHER): Payer: Self-pay | Admitting: *Deleted

## 2013-02-12 DIAGNOSIS — R1084 Generalized abdominal pain: Secondary | ICD-10-CM | POA: Insufficient documentation

## 2013-02-12 DIAGNOSIS — R112 Nausea with vomiting, unspecified: Secondary | ICD-10-CM | POA: Insufficient documentation

## 2013-02-12 DIAGNOSIS — Z79899 Other long term (current) drug therapy: Secondary | ICD-10-CM | POA: Insufficient documentation

## 2013-02-12 DIAGNOSIS — F319 Bipolar disorder, unspecified: Secondary | ICD-10-CM | POA: Insufficient documentation

## 2013-02-12 DIAGNOSIS — Z3202 Encounter for pregnancy test, result negative: Secondary | ICD-10-CM | POA: Insufficient documentation

## 2013-02-12 DIAGNOSIS — F410 Panic disorder [episodic paroxysmal anxiety] without agoraphobia: Secondary | ICD-10-CM | POA: Insufficient documentation

## 2013-02-12 DIAGNOSIS — Z87891 Personal history of nicotine dependence: Secondary | ICD-10-CM | POA: Insufficient documentation

## 2013-02-12 DIAGNOSIS — R1110 Vomiting, unspecified: Secondary | ICD-10-CM

## 2013-02-12 DIAGNOSIS — F909 Attention-deficit hyperactivity disorder, unspecified type: Secondary | ICD-10-CM | POA: Insufficient documentation

## 2013-02-12 DIAGNOSIS — F429 Obsessive-compulsive disorder, unspecified: Secondary | ICD-10-CM | POA: Insufficient documentation

## 2013-02-12 DIAGNOSIS — R109 Unspecified abdominal pain: Secondary | ICD-10-CM

## 2013-02-12 DIAGNOSIS — D649 Anemia, unspecified: Secondary | ICD-10-CM | POA: Insufficient documentation

## 2013-02-12 DIAGNOSIS — R079 Chest pain, unspecified: Secondary | ICD-10-CM | POA: Insufficient documentation

## 2013-02-12 DIAGNOSIS — F209 Schizophrenia, unspecified: Secondary | ICD-10-CM | POA: Insufficient documentation

## 2013-02-12 DIAGNOSIS — F4310 Post-traumatic stress disorder, unspecified: Secondary | ICD-10-CM | POA: Insufficient documentation

## 2013-02-12 LAB — CBC
MCV: 76.6 fL — ABNORMAL LOW (ref 78.0–100.0)
Platelets: 245 10*3/uL (ref 150–400)
RBC: 4.65 MIL/uL (ref 3.87–5.11)
WBC: 12.5 10*3/uL — ABNORMAL HIGH (ref 4.0–10.5)

## 2013-02-12 LAB — CBC WITH DIFFERENTIAL/PLATELET
Eosinophils Relative: 0 % (ref 0–5)
Lymphs Abs: 1.4 10*3/uL (ref 0.7–4.0)
MCH: 24.7 pg — ABNORMAL LOW (ref 26.0–34.0)
MCV: 76.1 fL — ABNORMAL LOW (ref 78.0–100.0)
Monocytes Absolute: 0.7 10*3/uL (ref 0.1–1.0)
Platelets: 242 10*3/uL (ref 150–400)
RBC: 4.65 MIL/uL (ref 3.87–5.11)
RDW: 22.3 % — ABNORMAL HIGH (ref 11.5–15.5)

## 2013-02-12 LAB — COMPREHENSIVE METABOLIC PANEL
ALT: 14 U/L (ref 0–35)
ALT: 15 U/L (ref 0–35)
AST: 21 U/L (ref 0–37)
AST: 21 U/L (ref 0–37)
CO2: 26 mEq/L (ref 19–32)
Calcium: 10.9 mg/dL — ABNORMAL HIGH (ref 8.4–10.5)
Chloride: 99 mEq/L (ref 96–112)
Creatinine, Ser: 0.7 mg/dL (ref 0.50–1.10)
GFR calc Af Amer: >90 mL/min (ref >90)
GFR calc non Af Amer: >90 mL/min (ref >90)
Sodium: 141 mEq/L (ref 135–145)
Sodium: 139 mEq/L (ref 135–145)
Total Bilirubin: 0.4 mg/dL (ref 0.3–1.2)
Total Protein: 8.4 g/dL — ABNORMAL HIGH (ref 6.0–8.3)

## 2013-02-12 LAB — URINALYSIS, ROUTINE W REFLEX MICROSCOPIC
Bilirubin Urine: NEGATIVE
Glucose, UA: NEGATIVE mg/dL
Hgb urine dipstick: NEGATIVE
Ketones, ur: 15 mg/dL — AB
Leukocytes, UA: NEGATIVE
Protein, ur: NEGATIVE mg/dL
Specific Gravity, Urine: 1.021 (ref 1.005–1.030)
pH: 7 (ref 5.0–8.0)

## 2013-02-12 LAB — URINE MICROSCOPIC-ADD ON

## 2013-02-12 LAB — LIPASE, BLOOD: Lipase: 88 U/L — ABNORMAL HIGH (ref 11–59)

## 2013-02-12 MED ORDER — OXYCODONE-ACETAMINOPHEN 5-325 MG PO TABS: 2.0000 | ORAL_TABLET | ORAL | Status: DC | PRN

## 2013-02-12 MED ORDER — MORPHINE SULFATE 4 MG/ML IJ SOLN
4.0000 mg | Freq: Once | INTRAMUSCULAR | Status: AC
  Administered 2013-02-12: 4 mg via INTRAVENOUS
  Filled 2013-02-12: qty 1

## 2013-02-12 MED ORDER — POTASSIUM CHLORIDE CRYS ER 20 MEQ PO TBCR
40.0000 meq | EXTENDED_RELEASE_TABLET | Freq: Once | ORAL | Status: AC
  Administered 2013-02-12: 40 meq via ORAL
  Filled 2013-02-12: qty 2

## 2013-02-12 MED ORDER — ONDANSETRON HCL 4 MG/2ML IJ SOLN
4.0000 mg | Freq: Once | INTRAMUSCULAR | Status: AC
  Administered 2013-02-12: 4 mg via INTRAVENOUS
  Filled 2013-02-12: qty 2

## 2013-02-12 MED ORDER — SODIUM CHLORIDE 0.9 % IV BOLUS (SEPSIS)
1000.0000 mL | Freq: Once | INTRAVENOUS | Status: AC
  Administered 2013-02-12: 1000 mL via INTRAVENOUS

## 2013-02-12 MED ORDER — ONDANSETRON HCL 8 MG PO TABS: 8.0000 mg | ORAL_TABLET | Freq: Three times a day (TID) | ORAL | Status: DC | PRN

## 2013-02-12 MED ORDER — KETOROLAC TROMETHAMINE 30 MG/ML IJ SOLN
30.0000 mg | Freq: Once | INTRAMUSCULAR | Status: AC
  Administered 2013-02-12: 30 mg via INTRAVENOUS
  Filled 2013-02-12: qty 1

## 2013-02-12 NOTE — ED Notes (Signed)
Pt states she has taken her zofran without relief.

## 2013-02-12 NOTE — ED Provider Notes (Signed)
History     CSN: [id number]  Arrival date & time 02/12/13  0447   First MD Initiated Contact with Patient 02/12/13 0459      Chief Complaint  Patient presents with  . Emesis    (Consider location/radiation/quality/duration/timing/severity/associated sxs/prior treatment) HPI Comments: Patient with history of Bipolar, ADHD, PTSD, Schizophrenia, OCD, Panic D/O.  Presents with complaints of severe nausea and vomiting since yesterday.  She reports a "burning" in her chest but denies abdominal pain.  No fevers, chills, diarrhea, or other complaints.  She has had episodes like this in the past on multiple occasions.  She tells me an endoscopy has been performed by someone in [place] in the past year or two.    Patient is a 42 y.o. female presenting with vomiting. The history is provided by the patient.  Emesis Severity:  Severe Duration:  1 day Timing:  Constant Quality:  Stomach contents Progression:  Worsening Relieved by:  Nothing Worsened by:  Liquids Ineffective treatments:  None tried Associated symptoms: no abdominal pain, no diarrhea and no fever     Past Medical History  Diagnosis Date  . Bipolar 1 disorder   . PTSD (post-traumatic stress disorder)   . ADHD (attention deficit hyperactivity disorder)   . Schizophrenia   . OCD (obsessive compulsive disorder)   . Panic   . Anemia     Past Surgical History  Procedure Laterality Date  . Ganglion cyst excision      No family history on file.  History  Substance Use Topics  . Smoking status: Former Games developer  . Smokeless tobacco: Not on file  . Alcohol Use: Yes     Comment: occasionally    OB History   Grav Para Term Preterm Abortions TAB SAB Ect Mult Living                  Review of Systems  Gastrointestinal: Positive for vomiting. Negative for abdominal pain and diarrhea.  All other systems reviewed and are negative.    Allergies  Review of patient's allergies indicates no known allergies.  Home  Medications   Current Outpatient Rx  Name  Route  Sig  Dispense  Refill  . clotrimazole (LOTRIMIN) 1 % cream      Apply to affected area 2 times daily   60 g   0   . lisdexamfetamine (VYVANSE) 50 MG capsule   Oral   Take 50 mg by mouth every morning.         Marland Kitchen omeprazole (PRILOSEC) 20 MG capsule   Oral   Take 1 capsule (20 mg total) by mouth daily.   30 capsule   0   . ondansetron (ZOFRAN) 4 MG tablet   Oral   Take 1 tablet (4 mg total) by mouth every 8 (eight) hours as needed for nausea.   12 tablet   0   . PARoxetine (PAXIL) 40 MG tablet   Oral   Take 40 mg by mouth every morning.         Marland Kitchen QUEtiapine (SEROQUEL) 100 MG tablet   Oral   Take 100 mg by mouth at bedtime.         . ARIPiprazole (ABILIFY) 15 MG tablet   Oral   Take 15 mg by mouth daily.         Marland Kitchen dicyclomine (BENTYL) 10 MG capsule   Oral   Take 10 mg by mouth 3 (three) times daily between meals as needed. For stomach spasms          .  ferrous sulfate 325 (65 FE) MG tablet   Oral   Take 325 mg by mouth daily with breakfast.         . HYDROcodone-acetaminophen (VICODIN) 5-500 MG per tablet   Oral   Take 1 tablet by mouth every 6 (six) hours as needed.         . lamoTRIgine (LAMICTAL) 100 MG tablet   Oral   Take 100 mg by mouth daily.         Marland Kitchen EXPIRED: omeprazole (PRILOSEC) 20 MG capsule   Oral   Take 20 mg by mouth daily.           . Ondansetron HCl (ZOFRAN IV)   Intravenous   Inject 4 mg into the vein.         Marland Kitchen oxyCODONE-acetaminophen (PERCOCET/ROXICET) 5-325 MG per tablet   Oral   Take 1-2 tablets by mouth every 6 (six) hours as needed for pain.   20 tablet   0   . EXPIRED: promethazine (PHENERGAN) 25 MG suppository   Rectal   Place 1 suppository (25 mg total) rectally every 6 (six) hours as needed for nausea.   12 each   0   . promethazine (PHENERGAN) 25 MG tablet   Oral   Take 25 mg by mouth every 6 (six) hours as needed.         . sertraline (ZOLOFT)  100 MG tablet   Oral   Take 150 mg by mouth daily.          . traMADol (ULTRAM) 50 MG tablet   Oral   Take 50 mg by mouth 2 (two) times daily as needed.         . traZODone (DESYREL) 100 MG tablet   Oral   Take 50 mg by mouth at bedtime.            BP 138/105  Pulse 117  Temp(Src) 98.4 F (36.9 C)  Resp 18  Ht 6' (1.829 m)  Wt 165 lb (74.844 kg)  BMI 22.37 kg/m2  SpO2 97%  Physical Exam  Nursing note and vitals reviewed. Constitutional: She is oriented to person, place, and time. She appears well-developed and well-nourished. No distress.  HENT:  Head: Normocephalic and atraumatic.  Neck: Normal range of motion. Neck supple.  Cardiovascular: Normal rate and regular rhythm.  Exam reveals no gallop and no friction rub.   No murmur heard. Pulmonary/Chest: Effort normal and breath sounds normal. No respiratory distress. She has no wheezes.  Abdominal: Soft. Bowel sounds are normal. She exhibits no distension. There is no tenderness.  Musculoskeletal: Normal range of motion.  Lymphadenopathy:    She has no cervical adenopathy.  Neurological: She is alert and oriented to person, place, and time.  Skin: Skin is warm and dry. She is not diaphoretic.    ED Course  Procedures (including critical care time)  Labs Reviewed  CBC WITH DIFFERENTIAL  LIPASE, BLOOD  COMPREHENSIVE METABOLIC PANEL  URINALYSIS, ROUTINE W REFLEX MICROSCOPIC  PREGNANCY, URINE   No results found.   No diagnosis found.    MDM  Patient with multiple ED and office visits for recurrent abdominal pain and vomiting that has thus far remained unexplained.  The workup today reveals a lipase of 88, but wbc or elevation of liver functions.  She has had both an endoscopy and ultrasound in the past that have been normal and I do not feel as though further workup or hospitalization are indicated.  She says she  is not feeling much better, however her exam remains reassuring.  Will discharge to home.  I  have encouraged her to follow up with her pcp to discuss.  As she is "out of her percocet", I will write her for a few more along with zofran for her nausea.        Geoffery Lyons, MD 02/12/13 [phone]

## 2013-02-12 NOTE — ED Notes (Signed)
Pt has not had any vomiting or dry heaves since receiving medication. Pt has been sleeping comfortably. Ice chips given.

## 2013-02-12 NOTE — ED Notes (Signed)
Pt has been discharged but continues lying in bed. Pt sts she is still in pain. I advised the patient that she has been seen and discharged with prescriptions for pain medicine but that she would not be receiving any pain medication here in the ER because she drove herself here. Pt asked again to get dressed.

## 2013-02-12 NOTE — ED Provider Notes (Signed)
History     CSN: [id number]  Arrival date & time 02/12/13  4098   First MD Initiated Contact with Patient 02/12/13 1924      Chief Complaint  Patient presents with  . Abdominal Pain    (Consider location/radiation/quality/duration/timing/severity/associated sxs/prior treatment) HPI Pt presenting with c/o continued abdominal pain and vomiting.  She was seen in the ED early this morning and discharged with rx for percocet- she states she has not gotten this filled.  She states he was told she could not have any narcotics without a ride, and now she has a ride so she came back to the ED.  Pain is diffuse, she has had several prior visits for similar symptoms.  States she had multiple episodes of emesis today and was vomiting up her zofran.  No fever.  She has been seen by GI, states she had an endoscopy and no cause of her symptoms were found.  She has been told to take PPI, zantac- states she is not currently taking because she didn;t know whether to take them every day or not.  There are no other associated systemic symptoms, there are no other alleviating or modifying factors.   Past Medical History  Diagnosis Date  . Bipolar 1 disorder   . PTSD (post-traumatic stress disorder)   . ADHD (attention deficit hyperactivity disorder)   . Schizophrenia   . OCD (obsessive compulsive disorder)   . Panic   . Anemia     Past Surgical History  Procedure Laterality Date  . Ganglion cyst excision      No family history on file.  History  Substance Use Topics  . Smoking status: Former Games developer  . Smokeless tobacco: Not on file  . Alcohol Use: Yes     Comment: occasionally    OB History   Grav Para Term Preterm Abortions TAB SAB Ect Mult Living                  Review of Systems ROS reviewed and all otherwise negative except for mentioned in HPI  Allergies  Review of patient's allergies indicates no known allergies.  Home Medications   Current Outpatient Rx  Name  Route  Sig   Dispense  Refill  . ARIPiprazole (ABILIFY) 15 MG tablet   Oral   Take 15 mg by mouth daily.         . clotrimazole (LOTRIMIN) 1 % cream      Apply to affected area 2 times daily   60 g   0   . dicyclomine (BENTYL) 10 MG capsule   Oral   Take 10 mg by mouth 3 (three) times daily between meals as needed. For stomach spasms          . ferrous sulfate 325 (65 FE) MG tablet   Oral   Take 325 mg by mouth daily with breakfast.         . HYDROcodone-acetaminophen (VICODIN) 5-500 MG per tablet   Oral   Take 1 tablet by mouth every 6 (six) hours as needed.         . lamoTRIgine (LAMICTAL) 100 MG tablet   Oral   Take 100 mg by mouth daily.         Marland Kitchen lisdexamfetamine (VYVANSE) 50 MG capsule   Oral   Take 50 mg by mouth every morning.         Marland Kitchen EXPIRED: omeprazole (PRILOSEC) 20 MG capsule   Oral   Take 20 mg by  mouth daily.           Marland Kitchen EXPIRED: omeprazole (PRILOSEC) 20 MG capsule   Oral   Take 1 capsule (20 mg total) by mouth daily.   30 capsule   0   . ondansetron (ZOFRAN) 4 MG tablet   Oral   Take 1 tablet (4 mg total) by mouth every 8 (eight) hours as needed for nausea.   12 tablet   0   . ondansetron (ZOFRAN) 8 MG tablet   Oral   Take 1 tablet (8 mg total) by mouth every 8 (eight) hours as needed for nausea.   6 tablet   1   . Ondansetron HCl (ZOFRAN IV)   Intravenous   Inject 4 mg into the vein.         Marland Kitchen oxyCODONE-acetaminophen (PERCOCET) 5-325 MG per tablet   Oral   Take 2 tablets by mouth every 4 (four) hours as needed for pain.   15 tablet   0   . oxyCODONE-acetaminophen (PERCOCET/ROXICET) 5-325 MG per tablet   Oral   Take 1-2 tablets by mouth every 6 (six) hours as needed for pain.   20 tablet   0   . PARoxetine (PAXIL) 40 MG tablet   Oral   Take 40 mg by mouth every morning.         Marland Kitchen EXPIRED: promethazine (PHENERGAN) 25 MG suppository   Rectal   Place 1 suppository (25 mg total) rectally every 6 (six) hours as needed for  nausea.   12 each   0   . promethazine (PHENERGAN) 25 MG tablet   Oral   Take 25 mg by mouth every 6 (six) hours as needed.         Marland Kitchen QUEtiapine (SEROQUEL) 100 MG tablet   Oral   Take 100 mg by mouth at bedtime.         . sertraline (ZOLOFT) 100 MG tablet   Oral   Take 150 mg by mouth daily.          . traMADol (ULTRAM) 50 MG tablet   Oral   Take 50 mg by mouth 2 (two) times daily as needed.         . traZODone (DESYREL) 100 MG tablet   Oral   Take 50 mg by mouth at bedtime.            BP 123/75  Pulse 68  Temp(Src) 98.4 F (36.9 C) (Oral)  Resp 16  SpO2 100% Vitals reviewed Physical Exam Physical Examination: General appearance - alert, well appearing, and in no distress Mental status - alert, oriented to person, place, and time Eyes - no conjunctival injection, no scleral icterus Mouth - mucous membranes moist, pharynx normal without lesions Chest - clear to auscultation, no wheezes, rales or rhonchi, symmetric air entry Heart - normal rate, regular rhythm, normal S1, S2, no murmurs, rubs, clicks or gallops Abdomen - soft, mild diffuse tenderness to palpation, no gaurding or rebound tenderness, nondistended, no masses or organomegaly Extremities - peripheral pulses normal, no pedal edema, no clubbing or cyanosis Skin - normal coloration and turgor, no rashes, no suspicious skin lesions noted Psych- calm and cooperative  ED Course  Procedures (including critical care time)  Labs Reviewed  CBC - Abnormal; Notable for the following:    WBC 12.5 (*)    Hemoglobin 11.5 (*)    HCT 35.6 (*)    MCV 76.6 (*)    MCH 24.7 (*)    RDW  22.1 (*)    All other components within normal limits  COMPREHENSIVE METABOLIC PANEL - Abnormal; Notable for the following:    Potassium 3.1 (*)    Glucose, Bld 115 (*)    All other components within normal limits  URINALYSIS, ROUTINE W REFLEX MICROSCOPIC - Abnormal; Notable for the following:    APPearance CLOUDY (*)     Ketones, ur 15 (*)    All other components within normal limits  LIPASE, BLOOD  PREGNANCY, URINE   No results found.   1. Abdominal pain   2. Nausea and vomiting       MDM  Pt presenting with c/o continued vomiting and abdominal pain.  Has not gotten her percocet rx filled.  Labs are reassuring, i do not feel there is indication for any imaging at this time.  Advised patient to arrange for a recheck with her primary care doctor and with GI as well.  Pt tolerated po fluids in the ED prior to discharge.  Discharged with strict return precautions.  Pt agreeable with plan.       Ethelda Chick, MD 02/13/13 [phone]

## 2013-02-12 NOTE — ED Notes (Addendum)
abdominal pain. States she was here earlier today and could not be treated due to no ride. She still does not have a ride but her pain is worse. She was informed at triage that she would still need a ride in order to get Narcotics.

## 2013-02-12 NOTE — ED Notes (Signed)
Pt c/o vomiting since yesterday. Also c/o chest pain. Pt denies diarrhea

## 2013-05-14 ENCOUNTER — Encounter: Payer: Self-pay | Admitting: Obstetrics & Gynecology

## 2013-07-05 ENCOUNTER — Ambulatory Visit (INDEPENDENT_AMBULATORY_CARE_PROVIDER_SITE_OTHER): Payer: Medicaid Other | Admitting: Obstetrics & Gynecology

## 2013-07-05 ENCOUNTER — Encounter: Payer: Self-pay | Admitting: Obstetrics & Gynecology

## 2013-07-05 VITALS — BP 137/92 | HR 92 | Temp 98.9°F | Wt 166.0 lb

## 2013-07-05 DIAGNOSIS — D259 Leiomyoma of uterus, unspecified: Secondary | ICD-10-CM

## 2013-07-05 DIAGNOSIS — D219 Benign neoplasm of connective and other soft tissue, unspecified: Secondary | ICD-10-CM

## 2013-07-05 DIAGNOSIS — Z309 Encounter for contraceptive management, unspecified: Secondary | ICD-10-CM

## 2013-07-05 DIAGNOSIS — Z124 Encounter for screening for malignant neoplasm of cervix: Secondary | ICD-10-CM

## 2013-07-05 DIAGNOSIS — Z3202 Encounter for pregnancy test, result negative: Secondary | ICD-10-CM

## 2013-07-05 DIAGNOSIS — IMO0001 Reserved for inherently not codable concepts without codable children: Secondary | ICD-10-CM

## 2013-07-05 LAB — CBC
MCH: 25.2 pg — ABNORMAL LOW (ref 26.0–34.0)
MCV: 79.3 fL (ref 78.0–100.0)
Platelets: 281 10*3/uL (ref 150–400)
RBC: 4.16 MIL/uL (ref 3.87–5.11)
RDW: 19 % — ABNORMAL HIGH (ref 11.5–15.5)

## 2013-07-05 NOTE — Patient Instructions (Signed)
Fibroids Fibroids are lumps (tumors) that can occur any place in a woman's body. These lumps are not cancerous. Fibroids vary in size, weight, and where they grow. HOME CARE  Do not take aspirin.  Write down the number of pads or tampons you use during your period. Tell your doctor. This can help determine the best treatment for you. GET HELP RIGHT AWAY IF:  You have pain in your lower belly (abdomen) that is not helped with medicine.  You have cramps that are not helped with medicine.  You have more bleeding between or during your period.  You feel lightheaded or pass out (faint).  Your lower belly pain gets worse. MAKE SURE YOU:  Understand these instructions.  Will watch your condition.  Will get help right away if you are not doing well or get worse. Document Released: 10/12/2010 Document Revised: 12/02/2011 Document Reviewed: 10/12/2010 Exit[employer] Patient Information 2014 [employer].  

## 2013-07-05 NOTE — Progress Notes (Signed)
Subjective:     Sara Yates is a 42 y.o. female here for a routine exam.  Current complaints: menstrual problems. Pt states she currently using the Nuvaring. Pt states she has been having bleeding most every day for the past 6 months. Pt states she is having moderate to heavy bleeding. Pt states she is using the Nuvaring properly.    Personal health questionnaire reviewed: yes.   Gynecologic History Patient's last menstrual period was 06/17/2013. Contraception: NuvaRing vaginal inserts Last Pap: 2012. Results were: unsure of results Last mammogram: 2014. Results were: normal  Obstetric History OB History  No data available     The following portions of the patient's history were reviewed and updated as appropriate: allergies, current medications, past family history, past medical history, past social history, past surgical history and problem list.  Review of Systems Pertinent items are noted in HPI.    Objective:      General:  alert     Abdomen: soft, non-tender; bowel sounds normal; no masses,  no organomegaly   Vulva:  normal  Vagina: normal vagina  Cervix:  no lesions  Corpus: 12 weeks size, irregular contour, position, consistency, mobility, non-tender  Adnexa:  normal adnexa       Assessment:   Moderately enlarged myomatous uterus AUB--L, O  Plan:   Sonohysterogram w/endometrial sampling Management options reviewed--pt education materials provided

## 2013-07-06 LAB — COMPREHENSIVE METABOLIC PANEL
ALT: 10 U/L (ref 0–35)
AST: 15 U/L (ref 0–37)
Albumin: 4.1 g/dL (ref 3.5–5.2)
Alkaline Phosphatase: 57 U/L (ref 39–117)
Glucose, Bld: 89 mg/dL (ref 70–99)
Potassium: 3.7 mEq/L (ref 3.5–5.3)
Sodium: 139 mEq/L (ref 135–145)
Total Protein: 6.9 g/dL (ref 6.0–8.3)

## 2013-07-07 LAB — PAP IG, CT-NG NAA, HPV HIGH-RISK

## 2013-07-07 LAB — ANTI MULLERIAN HORMONE: AMH AssessR: 0.36 ng/mL

## 2013-07-16 ENCOUNTER — Other Ambulatory Visit: Payer: Self-pay | Admitting: *Deleted

## 2013-07-16 DIAGNOSIS — D259 Leiomyoma of uterus, unspecified: Secondary | ICD-10-CM

## 2013-07-21 ENCOUNTER — Encounter: Payer: Self-pay | Admitting: Obstetrics & Gynecology

## 2013-07-21 ENCOUNTER — Ambulatory Visit (INDEPENDENT_AMBULATORY_CARE_PROVIDER_SITE_OTHER): Payer: Medicaid Other | Admitting: Obstetrics & Gynecology

## 2013-07-21 ENCOUNTER — Ambulatory Visit (INDEPENDENT_AMBULATORY_CARE_PROVIDER_SITE_OTHER): Payer: Medicaid Other

## 2013-07-21 VITALS — BP 157/103 | HR 77 | Temp 98.2°F | Wt 163.0 lb

## 2013-07-21 DIAGNOSIS — D259 Leiomyoma of uterus, unspecified: Secondary | ICD-10-CM | POA: Insufficient documentation

## 2013-07-21 NOTE — Progress Notes (Signed)
Subjective:     Sara Yates is a 42 y.o. female here for a routine exam.  Current complaints: review ultrasound results. Pt states she wouldl ike to review her options.   Personal health questionnaire reviewed: yes.   Gynecologic History Patient's last menstrual period was 06/17/2013. Contraception: Nuvaring   Obstetric History OB History  No data available     The following portions of the patient's history were reviewed and updated as appropriate: allergies, current medications, past family history, past medical history, past social history and problem list.  Review of Systems Pertinent items are noted in HPI.    Objective:     No exam today     Assessment:   AUB-L Plan:   Return for sonoHSG/endometrial biopsy

## 2013-07-21 NOTE — Patient Instructions (Signed)
Levonorgestrel intrauterine device (IUD) What is this medicine? LEVONORGESTREL IUD (LEE voe nor jes trel) is a contraceptive (birth control) device. The device is placed inside the uterus by a healthcare professional. It is used to prevent pregnancy and can also be used to treat heavy bleeding that occurs during your period. Depending on the device, it can be used for 3 to 5 years. This medicine may be used for other purposes; ask your health care provider or pharmacist if you have questions. What should I tell my health care provider before I take this medicine? They need to know if you have any of these conditions: -abnormal Pap smear -cancer of the breast, uterus, or cervix -diabetes -endometritis -genital or pelvic infection now or in the past -have more than one sexual partner or your partner has more than one partner -heart disease -history of an ectopic or tubal pregnancy -immune system problems -IUD in place -liver disease or tumor -problems with blood clots or take blood-thinners -use intravenous drugs -uterus of unusual shape -vaginal bleeding that has not been explained -an unusual or allergic reaction to levonorgestrel, other hormones, silicone, or polyethylene, medicines, foods, dyes, or preservatives -pregnant or trying to get pregnant -breast-feeding How should I use this medicine? This device is placed inside the uterus by a health care professional. Talk to your pediatrician regarding the use of this medicine in children. Special care may be needed. Overdosage: If you think you have taken too much of this medicine contact a poison control center or emergency room at once. NOTE: This medicine is only for you. Do not share this medicine with others. What if I miss a dose? This does not apply. What may interact with this medicine? Do not take this medicine with any of the following medications: -amprenavir -bosentan -fosamprenavir This medicine may also interact with  the following medications: -aprepitant -barbiturate medicines for inducing sleep or treating seizures -bexarotene -griseofulvin -medicines to treat seizures like carbamazepine, ethotoin, felbamate, oxcarbazepine, phenytoin, topiramate -modafinil -pioglitazone -rifabutin -rifampin -rifapentine -some medicines to treat HIV infection like atazanavir, indinavir, lopinavir, nelfinavir, tipranavir, ritonavir -St. John's wort -warfarin This list may not describe all possible interactions. Give your health care provider a list of all the medicines, herbs, non-prescription drugs, or dietary supplements you use. Also tell them if you smoke, drink alcohol, or use illegal drugs. Some items may interact with your medicine. What should I watch for while using this medicine? Visit your doctor or health care professional for regular check ups. See your doctor if you or your partner has sexual contact with others, becomes HIV positive, or gets a sexual transmitted disease. This product does not protect you against HIV infection (AIDS) or other sexually transmitted diseases. You can check the placement of the IUD yourself by reaching up to the top of your vagina with clean fingers to feel the threads. Do not pull on the threads. It is a good habit to check placement after each menstrual period. Call your doctor right away if you feel more of the IUD than just the threads or if you cannot feel the threads at all. The IUD may come out by itself. You may become pregnant if the device comes out. If you notice that the IUD has come out use a backup birth control method like condoms and call your health care provider. Using tampons will not change the position of the IUD and are okay to use during your period. What side effects may I notice from receiving this medicine?   Side effects that you should report to your doctor or health care professional as soon as possible: -allergic reactions like skin rash, itching or  hives, swelling of the face, lips, or tongue -fever, flu-like symptoms -genital sores -high blood pressure -no menstrual period for 6 weeks during use -pain, swelling, warmth in the leg -pelvic pain or tenderness -severe or sudden headache -signs of pregnancy -stomach cramping -sudden shortness of breath -trouble with balance, talking, or walking -unusual vaginal bleeding, discharge -yellowing of the eyes or skin Side effects that usually do not require medical attention (report to your doctor or health care professional if they continue or are bothersome): -acne -breast pain -change in sex drive or performance -changes in weight -cramping, dizziness, or faintness while the device is being inserted -headache -irregular menstrual bleeding within first 3 to 6 months of use -nausea This list may not describe all possible side effects. Call your doctor for medical advice about side effects. You may report side effects to FDA at 1-800-FDA-1088. Where should I keep my medicine? This does not apply. NOTE: This sheet is a summary. It may not cover all possible information. If you have questions about this medicine, talk to your doctor, pharmacist, or health care provider.  2013, [employer]vier/[employer] Standard. (10/10/2011 1:54:04 PM)  

## 2013-08-04 ENCOUNTER — Ambulatory Visit (INDEPENDENT_AMBULATORY_CARE_PROVIDER_SITE_OTHER): Payer: Medicaid Other | Admitting: Obstetrics & Gynecology

## 2013-08-04 ENCOUNTER — Other Ambulatory Visit: Payer: Self-pay

## 2013-08-04 ENCOUNTER — Encounter: Payer: Self-pay | Admitting: Obstetrics & Gynecology

## 2013-08-04 VITALS — BP 157/98 | HR 67 | Temp 99.1°F | Ht 71.0 in | Wt 169.0 lb

## 2013-08-04 DIAGNOSIS — N926 Irregular menstruation, unspecified: Secondary | ICD-10-CM

## 2013-08-04 DIAGNOSIS — Z30430 Encounter for insertion of intrauterine contraceptive device: Secondary | ICD-10-CM

## 2013-08-04 DIAGNOSIS — N939 Abnormal uterine and vaginal bleeding, unspecified: Secondary | ICD-10-CM

## 2013-08-04 NOTE — Progress Notes (Signed)
Endometrial Biopsy Procedure Note  Pre-operative Diagnosis: AUB  Post-operative Diagnosis: same  Indications: abnormal uterine bleeding  Procedure Details    The risks (including infection, bleeding, pain, and uterine perforation) and benefits of the procedure were explained to the patient and Written informed consent was obtained.     The patient was placed in the dorsal lithotomy position.  Bimanual exam showed the uterus to be in the neutral position.  A Graves' speculum inserted in the vagina, and the cervix prepped with povidone iodine.  Endocervical curettage with a Kevorkian curette was not performed.   A sharp tenaculum was applied to the anterior lip of the cervix for stabilization.  A sterile uterine sound was used to sound the uterus to a depth of 9cm.  A Pipelle endometrial aspirator was used to sample the endometrium.  Sample was sent for pathologic examination.   IUD Insertion Procedure Note  Procedure Details    The risks (including infection, bleeding, pain, and uterine perforation) and benefits of the procedure were explained to the patient and Written informed consent was obtained.     IUD inserted without difficulty. String visible and trimmed. Patient tolerated procedure well.  An informal U/S showed appropriate positioning.   Plan:  The patient was advised to call for any fever or for prolonged or severe pain or bleeding. She was advised to use OTC analgesics as needed for mild to moderate pain.

## 2013-08-09 ENCOUNTER — Encounter: Payer: Self-pay | Admitting: Obstetrics & Gynecology

## 2013-10-18 ENCOUNTER — Ambulatory Visit (INDEPENDENT_AMBULATORY_CARE_PROVIDER_SITE_OTHER): Payer: Medicaid Other | Admitting: Obstetrics

## 2013-10-18 VITALS — BP 173/98 | HR 80 | Temp 98.9°F | Ht 71.0 in | Wt 176.0 lb

## 2013-10-18 DIAGNOSIS — D259 Leiomyoma of uterus, unspecified: Secondary | ICD-10-CM

## 2013-10-18 DIAGNOSIS — N926 Irregular menstruation, unspecified: Secondary | ICD-10-CM

## 2013-10-18 DIAGNOSIS — N939 Abnormal uterine and vaginal bleeding, unspecified: Secondary | ICD-10-CM

## 2013-10-18 DIAGNOSIS — Z113 Encounter for screening for infections with a predominantly sexual mode of transmission: Secondary | ICD-10-CM

## 2013-10-18 LAB — POCT URINE PREGNANCY: PREG TEST UR: NEGATIVE

## 2013-10-18 NOTE — Progress Notes (Signed)
Subjective:     Sara Yates is a 43 y.o. female here for a routine exam.  Current complaints: Pt is in need of psych consult/therapist per her request. Pt states that she needs ADHD medication. Pt recently went to walk in clinic and didn't get treatment she feels she needed.  Pt fears that she may need anti depressant and request Rx.  Pt currently has Mirena in place and [employer] with bleeding still occurring.  Pt states that cycle varies in duration and appearrnace.  Pt states that cycle never really ends.  Pt states that she is quite angry due to prolong cycles.  Pt would like exam today with STD testing.  Pt states that she has a new sexual partner.  Personal health questionnaire reviewed: yes.   Gynecologic History No LMP recorded. Contraception: IUD and NuvaRing vaginal inserts Last Pap: 06/2013. Results were: normal Last mammogram: n/a  Obstetric History OB History  No data available     The following portions of the patient's history were reviewed and updated as appropriate: allergies, current medications, past family history, past medical history, past social history, past surgical history and problem list.  Review of Systems Pertinent items are noted in HPI.    Objective:    General appearance: alert and no distress Abdomen: normal findings: soft, non-tender Pelvic: cervix normal in appearance, external genitalia normal, no adnexal masses or tenderness, no cervical motion tenderness, rectovaginal septum normal, vagina normal without discharge and uterus enlarged, NT.    Assessment:    AUB  Uterine fibroids, symptomatic.   Plan:    Education reviewed: safe sex/STD prevention and management of AUB with fibroids.. Contraception: IUD. Follow up in: 2 weeks.    Dr. Delsa Sale will discuss further management options.

## 2013-10-19 LAB — WET PREP BY MOLECULAR PROBE
CANDIDA SPECIES: NEGATIVE
Gardnerella vaginalis: NEGATIVE
TRICHOMONAS VAG: NEGATIVE

## 2013-10-20 LAB — GC/CHLAMYDIA PROBE AMP
CT Probe RNA: NEGATIVE
GC PROBE AMP APTIMA: NEGATIVE

## 2013-11-01 ENCOUNTER — Encounter: Payer: Self-pay | Admitting: Obstetrics & Gynecology

## 2013-11-01 ENCOUNTER — Ambulatory Visit (INDEPENDENT_AMBULATORY_CARE_PROVIDER_SITE_OTHER): Payer: Medicaid Other | Admitting: Obstetrics & Gynecology

## 2013-11-01 VITALS — BP 198/100 | HR 94 | Temp 98.4°F | Ht 72.0 in | Wt 182.0 lb

## 2013-11-01 DIAGNOSIS — F3289 Other specified depressive episodes: Secondary | ICD-10-CM

## 2013-11-01 DIAGNOSIS — M79603 Pain in arm, unspecified: Secondary | ICD-10-CM

## 2013-11-01 DIAGNOSIS — F329 Major depressive disorder, single episode, unspecified: Secondary | ICD-10-CM

## 2013-11-01 DIAGNOSIS — M79609 Pain in unspecified limb: Secondary | ICD-10-CM

## 2013-11-01 DIAGNOSIS — F32A Depression, unspecified: Secondary | ICD-10-CM

## 2013-11-01 NOTE — Progress Notes (Signed)
Subjective:     Sara Yates is a 43 y.o. female here for a routine exam.  Current complaints: Patient in office today for follow up of Mirena Insert and firboids. Patient states she would like to know if the Mirena is making her gain weight. Patient states she has gained 15 lbs since the Mirena has been in. Patient sates she would like to know if she could have some anxiety medication prescribed or if she could get a referral for a psychiatrist and therapist. Patient states she is having mood swings, panic attacks, stress, anxiety spells. Patient states she is over eating. Patient states she doesn't trust anyone. Patient states she would like her test results. Patient states she was recently diagnosed with degenerative arthritis. Patient states she is having an aching pain in her shoulders. Patient states pain is so bad it is waking her up at night. Patient states she would like to know what she should do. Patient states she thinks her blood pressure is high due to her stress and the fact that she is not on her medication. Patient states she is having a lot of spotting since the Mirena insertion. Patient states she has a white/yellowish watery discharge. Patient states sometimes the discharge has been blood tinged to dark brown. Patient denies any itching, irration, burning or vaginal odor. Patient states she has been unable to distinguish when her cycles have started.  Patient states she has also been using the Bath.  Personal health questionnaire reviewed: yes.   Gynecologic History No LMP recorded. Contraception: IUD and NuvaRing vaginal inserts  Obstetric History OB History  Gravida Para Term Preterm AB SAB TAB Ectopic Multiple Living  14 5 3 2 9 4 5   5     # Outcome Date GA Lbr Len/2nd Weight Sex Delivery Anes PTL Lv  14 TAB 2011        N  13 TRM 01/13/06 [redacted]w[redacted]d   M SVD None  Y  12 PRE 02/01/04 [redacted]w[redacted]d  2 lb 14 oz (1.304 kg) F SVD None  Y  11 SAB 1999        N  10 PRE 07/16/97 [redacted]w[redacted]d  1 lb  13 oz (0.822 kg) F SVD None  Y  9 SAB 1998        N  8 TRM 07/03/96 [redacted]w[redacted]d  7 lb 12 oz (3.515 kg) F SVD None  Y  7 SAB 1997        N  6 TAB 1994        N  5 TAB 1993        N  4 SAB 1992        N  3 TAB 1992        N  2 TAB 1991        N  1 TRM 02/23/88 [redacted]w[redacted]d  6 lb 12 oz (3.062 kg) F SVD EPI  Y       The following portions of the patient's history were reviewed and updated as appropriate: allergies, current medications, past family history, past medical history, past social history, past surgical history and problem list.  Review of Systems Pertinent items are noted in HPI.   Objective:    BP 198/100  Pulse 94  Temp(Src) 98.4 F (36.9 C)  Ht 6' (1.829 m)  Wt 182 lb (82.555 kg)  BMI 24.68 kg/m2        No exam performed       Assessment:  Multiple somatic  complaints    Plan:   Referral to behavioral health, Internal medicine outpatient clinic Return in 6 mths

## 2013-11-11 ENCOUNTER — Ambulatory Visit: Payer: Medicaid Other | Admitting: Obstetrics & Gynecology

## 2013-11-30 ENCOUNTER — Encounter: Payer: Self-pay | Admitting: Obstetrics & Gynecology

## 2014-01-11 ENCOUNTER — Other Ambulatory Visit: Payer: Self-pay | Admitting: Advanced Practice Midwife

## 2014-01-11 ENCOUNTER — Ambulatory Visit: Payer: Medicaid Other | Admitting: Advanced Practice Midwife

## 2014-01-11 ENCOUNTER — Encounter: Payer: Self-pay | Admitting: Advanced Practice Midwife

## 2014-01-11 ENCOUNTER — Ambulatory Visit (INDEPENDENT_AMBULATORY_CARE_PROVIDER_SITE_OTHER): Payer: Medicaid Other

## 2014-01-11 ENCOUNTER — Ambulatory Visit (INDEPENDENT_AMBULATORY_CARE_PROVIDER_SITE_OTHER): Payer: Medicaid Other | Admitting: Advanced Practice Midwife

## 2014-01-11 VITALS — BP 165/97 | HR 76 | Temp 98.6°F | Ht 72.0 in | Wt 187.0 lb

## 2014-01-11 DIAGNOSIS — Z113 Encounter for screening for infections with a predominantly sexual mode of transmission: Secondary | ICD-10-CM

## 2014-01-11 DIAGNOSIS — Z3200 Encounter for pregnancy test, result unknown: Secondary | ICD-10-CM

## 2014-01-11 DIAGNOSIS — IMO0002 Reserved for concepts with insufficient information to code with codable children: Secondary | ICD-10-CM

## 2014-01-11 DIAGNOSIS — I10 Essential (primary) hypertension: Secondary | ICD-10-CM

## 2014-01-11 DIAGNOSIS — N898 Other specified noninflammatory disorders of vagina: Secondary | ICD-10-CM

## 2014-01-11 DIAGNOSIS — Z30432 Encounter for removal of intrauterine contraceptive device: Secondary | ICD-10-CM

## 2014-01-11 DIAGNOSIS — B379 Candidiasis, unspecified: Secondary | ICD-10-CM

## 2014-01-11 DIAGNOSIS — Z30431 Encounter for routine checking of intrauterine contraceptive device: Secondary | ICD-10-CM

## 2014-01-11 LAB — POCT URINE PREGNANCY: Preg Test, Ur: NEGATIVE

## 2014-01-11 MED ORDER — FLUCONAZOLE 150 MG PO TABS: 150.0000 mg | ORAL_TABLET | Freq: Once | ORAL | Status: DC

## 2014-01-11 MED ORDER — ETONOGESTREL-ETHINYL ESTRADIOL 0.12-0.015 MG/24HR VA RING: VAGINAL_RING | VAGINAL | Status: DC

## 2014-01-11 NOTE — Progress Notes (Signed)
  Subjective:     Sara Yates is a 43 y.o. female who presents for evaluation of abdominal pain. The pain is described as cramping, pressure-like and sharp, and is 5/10 in intensity. Pain is located in the lower quadrant area without radiation. Onset was ongoing occurring with intercourse ,. Symptoms have been unchanged since. Aggravating factors: penetration. Alleviating factors: none. Associated symptoms: vaginal discharge.  Risk factors for pelvic/abdominal pain include IUD in place.  Patient reports new partners. States she desires GC/CT screening. Declines blood draw screening.   Reports vaginal discharge for 1.5-2 weeks. States she has yellow discharge that has changed from watery. Denies itching or burning. Reports a history of BV and yeast.  Patient reports she liked having the nuva ring and IUD at the same time. Is wondering if she can have this option. She reports she had less pelvic pain when she utilized both methods and she liked having the extra protection to prevent pregnancy.  Menstrual History: OB History   Grav Para Term Preterm Abortions TAB SAB Ect Mult Living   14 5 3 2 9 5 4   5       Menarche age: 42  Patient's last menstrual period was 12/23/2013.    The following portions of the patient's history were reviewed and updated as appropriate: allergies, current medications, past family history, past medical history, past social history, past surgical history and problem list.   Review of Systems Genitourinary:positive for vaginal discharge and pain w/ intercourse All other ROS were reviewed and negative.    Objective:    BP 165/97  Pulse 76  Temp(Src) 98.6 F (37 C)  Ht 6' (1.829 m)  Wt 187 lb (84.823 kg)  BMI 25.36 kg/m2  LMP 12/23/2013 General:   alert and cooperative              Pelvis:  Vulva and vagina appear normal. Bimanual exam reveals normal uterus and adnexa.  Extremities:   extremities normal, atraumatic, no cyanosis or edema  Neurologic:    negative  Psychiatric:   normal mood, behavior, speech, dress, and thought processes  Microscopic wet-mount exam shows hyphae. BP 160/100 BP 159/97  Lab Review Labs: Urine pregnancy test, GC/Chlamydia DNA probe of cervico/vaginal secretions and Wet mount of vaginal secretions   Imaging Ultrasound - Pelvic Vaginal  Reviewed  IUD removed w/ out difficulty, patient tolerated procedure well.   Assessment:   Dyspareunia Yeast Infection IUD mal-positioned High Blood Pressure Poor Candidate for Nuva Ring at this time    Plan:    The diagnosis was discussed with the patient and evaluation and treatment plans outlined. Follow up as needed. Diflucan 150 mg PO once at this time   IUD Pelvic US: done at visit, IUD removed today due to poor position BCM: Condoms, I gave a patient a nuva ring but advised her on the increased risk due to BP and age. I strongly encouraged patient to not get pregnant due to co-morbidities. Patient reported understanding.  Encouraged patient to retake pregnancy test in approx 2 weeks. I recommend the patient avoid pregnancy and RTC for consult w/ MD Delsa Sale. Patient to consider surgical management for [facility] and uterine fibroids.  New Diagnosis of HTN: patient referred to internal medicine    Orders Placed This Encounter  Procedures  . POCT urine pregnancy   Cuca Benassi Roni Bread CNM

## 2014-01-12 ENCOUNTER — Encounter: Payer: Self-pay | Admitting: Obstetrics & Gynecology

## 2014-01-12 LAB — GC/CHLAMYDIA PROBE AMP
CT Probe RNA: NEGATIVE
GC PROBE AMP APTIMA: NEGATIVE

## 2014-01-12 LAB — WET PREP BY MOLECULAR PROBE
CANDIDA SPECIES: NEGATIVE
GARDNERELLA VAGINALIS: POSITIVE — AB
TRICHOMONAS VAG: NEGATIVE

## 2014-01-16 ENCOUNTER — Encounter: Payer: Self-pay | Admitting: Obstetrics & Gynecology

## 2014-01-16 DIAGNOSIS — T8332XA Displacement of intrauterine contraceptive device, initial encounter: Secondary | ICD-10-CM | POA: Insufficient documentation

## 2014-01-20 ENCOUNTER — Other Ambulatory Visit: Payer: Self-pay | Admitting: *Deleted

## 2014-01-20 DIAGNOSIS — N760 Acute vaginitis: Principal | ICD-10-CM

## 2014-01-20 DIAGNOSIS — B9689 Other specified bacterial agents as the cause of diseases classified elsewhere: Secondary | ICD-10-CM

## 2014-01-20 MED ORDER — METRONIDAZOLE 500 MG PO TABS: 500.0000 mg | ORAL_TABLET | Freq: Two times a day (BID) | ORAL | Status: DC

## 2014-03-10 ENCOUNTER — Ambulatory Visit: Payer: Medicaid Other | Admitting: Obstetrics & Gynecology

## 2014-03-11 ENCOUNTER — Ambulatory Visit: Payer: Medicaid Other | Admitting: Advanced Practice Midwife

## 2014-03-12 ENCOUNTER — Emergency Department (HOSPITAL_BASED_OUTPATIENT_CLINIC_OR_DEPARTMENT_OTHER)
Admission: EM | Admit: 2014-03-12 | Discharge: 2014-03-12 | Disposition: A | Discharge status: Home / Self Care | Payer: Medicaid Other | Attending: Emergency Medicine | Admitting: Emergency Medicine

## 2014-03-12 ENCOUNTER — Emergency Department (HOSPITAL_BASED_OUTPATIENT_CLINIC_OR_DEPARTMENT_OTHER): Payer: Medicaid Other

## 2014-03-12 ENCOUNTER — Encounter (HOSPITAL_BASED_OUTPATIENT_CLINIC_OR_DEPARTMENT_OTHER): Payer: Self-pay | Admitting: Emergency Medicine

## 2014-03-12 DIAGNOSIS — F4310 Post-traumatic stress disorder, unspecified: Secondary | ICD-10-CM | POA: Insufficient documentation

## 2014-03-12 DIAGNOSIS — F410 Panic disorder [episodic paroxysmal anxiety] without agoraphobia: Secondary | ICD-10-CM | POA: Insufficient documentation

## 2014-03-12 DIAGNOSIS — B9689 Other specified bacterial agents as the cause of diseases classified elsewhere: Secondary | ICD-10-CM | POA: Insufficient documentation

## 2014-03-12 DIAGNOSIS — D259 Leiomyoma of uterus, unspecified: Secondary | ICD-10-CM | POA: Insufficient documentation

## 2014-03-12 DIAGNOSIS — Z91040 Latex allergy status: Secondary | ICD-10-CM | POA: Insufficient documentation

## 2014-03-12 DIAGNOSIS — Z87891 Personal history of nicotine dependence: Secondary | ICD-10-CM | POA: Insufficient documentation

## 2014-03-12 DIAGNOSIS — Z8739 Personal history of other diseases of the musculoskeletal system and connective tissue: Secondary | ICD-10-CM | POA: Insufficient documentation

## 2014-03-12 DIAGNOSIS — D649 Anemia, unspecified: Secondary | ICD-10-CM | POA: Insufficient documentation

## 2014-03-12 DIAGNOSIS — A499 Bacterial infection, unspecified: Secondary | ICD-10-CM | POA: Insufficient documentation

## 2014-03-12 DIAGNOSIS — N760 Acute vaginitis: Secondary | ICD-10-CM | POA: Insufficient documentation

## 2014-03-12 DIAGNOSIS — F319 Bipolar disorder, unspecified: Secondary | ICD-10-CM | POA: Insufficient documentation

## 2014-03-12 DIAGNOSIS — F209 Schizophrenia, unspecified: Secondary | ICD-10-CM | POA: Insufficient documentation

## 2014-03-12 DIAGNOSIS — F909 Attention-deficit hyperactivity disorder, unspecified type: Secondary | ICD-10-CM | POA: Insufficient documentation

## 2014-03-12 DIAGNOSIS — Z3202 Encounter for pregnancy test, result negative: Secondary | ICD-10-CM | POA: Insufficient documentation

## 2014-03-12 LAB — HIV ANTIBODY (ROUTINE TESTING W REFLEX): HIV 1&2 Ab, 4th Generation: NONREACTIVE

## 2014-03-12 LAB — URINALYSIS, ROUTINE W REFLEX MICROSCOPIC
Bilirubin Urine: NEGATIVE
Glucose, UA: NEGATIVE mg/dL
Hgb urine dipstick: NEGATIVE
Ketones, ur: NEGATIVE mg/dL
LEUKOCYTES UA: NEGATIVE
NITRITE: NEGATIVE
PROTEIN: NEGATIVE mg/dL
Specific Gravity, Urine: 1.013 (ref 1.005–1.030)
Urobilinogen, UA: 0.2 mg/dL (ref 0.0–1.0)
pH: 5 (ref 5.0–8.0)

## 2014-03-12 LAB — WET PREP, GENITAL
TRICH WET PREP: NONE SEEN
YEAST WET PREP: NONE SEEN

## 2014-03-12 LAB — RPR

## 2014-03-12 LAB — PREGNANCY, URINE: Preg Test, Ur: NEGATIVE

## 2014-03-12 MED ORDER — METRONIDAZOLE 500 MG PO TABS: 500.0000 mg | ORAL_TABLET | Freq: Two times a day (BID) | ORAL | Status: DC

## 2014-03-12 NOTE — ED Notes (Signed)
Pt with vaginal discomfort, menstruation, and "I'm afraid I may have a [STI]"

## 2014-03-12 NOTE — ED Notes (Signed)
Pelvic cart set up at bedside  

## 2014-03-12 NOTE — Discharge Instructions (Signed)
Your ultrasound demonstrated that your have fibroids in your uterus. Your vaginal exam demonstrated a bacterial infection. You will be given Flagyl to treat the infection, do not drink alcohol while taking this medication. Use condoms with each sexual encounter. Follow up with your primary care doctor in 1 week.  Abdominal (belly) pain can be caused by many things. Your caregiver performed an examination and possibly ordered blood/urine tests and imaging (CT scan, x-rays, ultrasound). Many cases can be observed and treated at home after initial evaluation in the emergency department. Even though you are being discharged home, abdominal pain can be unpredictable. Therefore, you need a repeated exam if your pain does not resolve, returns, or worsens. Most patients with abdominal pain don't have to be admitted to the hospital or have surgery, but serious problems like appendicitis and gallbladder attacks can start out as nonspecific pain. Many abdominal conditions cannot be diagnosed in one visit, so follow-up evaluations are very important. SEEK IMMEDIATE MEDICAL ATTENTION IF: The pain does not go away or becomes severe.  A temperature above 101 develops.  Repeated vomiting occurs (multiple episodes).  The pain becomes localized to portions of the abdomen. The right side could possibly be appendicitis. In an adult, the left lower portion of the abdomen could be colitis or diverticulitis.  Blood is being passed in stools or vomit (bright red or black tarry stools).  Return also if you develop chest pain, difficulty breathing, dizziness or fainting, or become confused, poorly responsive, or inconsolable (young children).   Bacterial Vaginosis Bacterial vaginosis is a vaginal infection that occurs when the normal balance of bacteria in the vagina is disrupted. It results from an overgrowth of certain bacteria. This is the most common vaginal infection in women of childbearing age. Treatment is important to  prevent complications, especially in pregnant women, as it can cause a premature delivery. CAUSES  Bacterial vaginosis is caused by an increase in harmful bacteria that are normally present in smaller amounts in the vagina. Several different kinds of bacteria can cause bacterial vaginosis. However, the reason that the condition develops is not fully understood. RISK FACTORS Certain activities or behaviors can put you at an increased risk of developing bacterial vaginosis, including:  Having a new sex partner or multiple sex partners.  Douching.  Using an intrauterine device (IUD) for contraception. Women do not get bacterial vaginosis from toilet seats, bedding, swimming pools, or contact with objects around them. SIGNS AND SYMPTOMS  Some women with bacterial vaginosis have no signs or symptoms. Common symptoms include:  Grey vaginal discharge.  A fishlike odor with discharge, especially after sexual intercourse.  Itching or burning of the vagina and vulva.  Burning or pain with urination. DIAGNOSIS  Your health care provider will take a medical history and examine the vagina for signs of bacterial vaginosis. A sample of vaginal fluid may be taken. Your health care provider will look at this sample under a microscope to check for bacteria and abnormal cells. A vaginal pH test may also be done.  TREATMENT  Bacterial vaginosis may be treated with antibiotic medicines. These may be given in the form of a pill or a vaginal cream. A second round of antibiotics may be prescribed if the condition comes back after treatment.  HOME CARE INSTRUCTIONS   Only take over-the-counter or prescription medicines as directed by your health care provider.  If antibiotic medicine was prescribed, take it as directed. Make sure you finish it even if you start to feel better.  Do not have sex until treatment is completed.  Tell all sexual partners that you have a vaginal infection. They should see their  health care provider and be treated if they have problems, such as a mild rash or itching.  Practice safe sex by using condoms and only having one sex partner. SEEK MEDICAL CARE IF:   Your symptoms are not improving after 3 days of treatment.  You have increased discharge or pain.  You have a fever. MAKE SURE YOU:   Understand these instructions.  Will watch your condition.  Will get help right away if you are not doing well or get worse. FOR MORE INFORMATION  Centers for Disease Control and Prevention, Division of STD Prevention: [employer] American Sexual Health Association (ASHA): www.ashastd.org  Document Released: 09/09/2005 Document Revised: 06/30/2013 Document Reviewed: 04/21/2013 [facility] Patient Information 2015 [place], [place]. This information is not intended to replace advice given to you by your health care provider. Make sure you discuss any questions you have with your health care provider.

## 2014-03-12 NOTE — ED Notes (Signed)
Patient transported to Ultrasound 

## 2014-03-12 NOTE — ED Provider Notes (Signed)
CSN: [id number]     Arrival date & time 03/12/14  1308 History   First MD Initiated Contact with Patient 03/12/14 1403     Chief Complaint  Patient presents with  . Exposure to STD  . Abdominal Pain     (Consider location/radiation/quality/duration/timing/severity/associated sxs/prior Treatment) HPI Comments: Sara Yates is a 43 y.o. Female G14P5 with a PMHx of bipolar, PTSD, ADHD, OCD, panic disorder, schizophrenia, and anemia who presents to the ED with complaints of vaginal pain and concerns of having an STI. She reports that she was had unprotected vaginal and anal sex with a female partner on Wednesday 03/09/14 and developed a burning discomfort and itching in her vagina associated with lower abdomen/pelvis pain that began Thursday. She states it worsens when she urinates, and has had increased urinary frequency. Also endorses vaginal itching and whiteish d/c. Tried OTC yeast medications from [facility] (a cream for yeast and a pill for symptoms) which have not helped. Has a hx of STIs in the past (trich and chlamydia), but has not been tested recently. Started her menses on Thursday 03/10/14. Denies fevers/chills, flank pain, N/V/D/C, dyspareunia, hematuria, hematochezia, melena, CP, SOB, myalgias, arthralgias, or rash. Has felt very sad lately due to issues with her ex-husband and not being able to see her children. Denies SI/HI. Sees a therapist but has not been following up regularly. Feels safe at home, denies any sexual assaults.  Patient is a 43 y.o. female presenting with STD exposure and abdominal pain. The history is provided by the patient. No language interpreter was used.  Exposure to STD This is a new problem. The current episode started in the past 7 days. The problem has been unchanged. Associated symptoms include abdominal pain and urinary symptoms. Pertinent negatives include no arthralgias, change in bowel habit, chest pain, chills, coughing, diaphoresis, fever, headaches, joint  swelling, myalgias, nausea, neck pain, numbness, rash, sore throat, swollen glands, vomiting or weakness. Nothing aggravates the symptoms. Treatments tried: OTC yeast medications. The treatment provided no relief.  Abdominal Pain Associated symptoms: vaginal discharge   Associated symptoms: no chest pain, no chills, no constipation, no cough, no diarrhea, no dysuria, no fever, no hematuria, no nausea, no shortness of breath, no sore throat, no vaginal bleeding and no vomiting     Past Medical History  Diagnosis Date  . Bipolar 1 disorder   . PTSD (post-traumatic stress disorder)   . ADHD (attention deficit hyperactivity disorder)   . Schizophrenia   . OCD (obsessive compulsive disorder)   . Panic   . Anemia   . Arthritis     degernative    Past Surgical History  Procedure Laterality Date  . Ganglion cyst excision     Family History  Problem Relation Age of Onset  . Cancer Mother   . Cancer Father    History  Substance Use Topics  . Smoking status: Former Smoker    Types: Cigars  . Smokeless tobacco: Never Used  . Alcohol Use: Yes     Comment: occasionally   OB History   Grav Para Term Preterm Abortions TAB SAB Ect Mult Living   14 5 3 2 9 5 4   5      Review of Systems  Constitutional: Negative for fever, chills, diaphoresis and unexpected weight change.  HENT: Negative for mouth sores and sore throat.   Eyes: Negative for visual disturbance.  Respiratory: Negative for cough, chest tightness, shortness of breath and wheezing.   Cardiovascular: Negative for chest pain.  Gastrointestinal: Positive for abdominal pain. Negative for nausea, vomiting, diarrhea, constipation, blood in stool, abdominal distention, anal bleeding, rectal pain and change in bowel habit.       +rectal discharge (orangeish, per pt), no anal bleeding or pain  Genitourinary: Positive for frequency, vaginal discharge, vaginal pain and pelvic pain. Negative for dysuria, urgency, hematuria, flank pain,  decreased urine volume, vaginal bleeding, enuresis, difficulty urinating, genital sores, menstrual problem and dyspareunia.  Musculoskeletal: Negative for arthralgias, back pain, joint swelling, myalgias and neck pain.  Skin: Negative for rash.  Neurological: Negative for dizziness, syncope, weakness, numbness and headaches.  Psychiatric/Behavioral: Negative for suicidal ideas and self-injury.       +Sadness, denies SI/HI  10 Systems reviewed and are negative for acute change except as noted in the HPI.     Allergies  Latex  Home Medications   Prior to Admission medications   Medication Sig Start Date End Date Taking? Authorizing Provider  ferrous sulfate 325 (65 FE) MG tablet Take 325 mg by mouth daily with breakfast.   Yes Historical Provider, MD  FLUoxetine (PROZAC) 40 MG capsule Take 30 mg by mouth daily.   Yes Historical Provider, MD  lamoTRIgine (LAMICTAL) 100 MG tablet Take 100 mg by mouth daily.   Yes Historical Provider, MD  lisdexamfetamine (VYVANSE) 50 MG capsule Take 50 mg by mouth every morning.   Yes Historical Provider, MD  QUEtiapine (SEROQUEL) 100 MG tablet Take 100 mg by mouth at bedtime.   Yes Historical Provider, MD  asenapine (SAPHRIS) 5 MG SUBL 24 hr tablet Place 5 mg under the tongue 2 (two) times daily.    Historical Provider, MD  etonogestrel-ethinyl estradiol (NUVARING) 0.12-0.015 MG/24HR vaginal ring Insert vaginally and leave in place for 3 consecutive weeks, then remove for 1 week. 01/11/14   Amy Thereasa Parkin, CNM  fluconazole (DIFLUCAN) 150 MG tablet Take 1 tablet (150 mg total) by mouth once. 01/11/14   Amy Thereasa Parkin, CNM  methylphenidate (RITALIN) 10 MG tablet Take 10 mg by mouth 2 (two) times daily.    Historical Provider, MD  metroNIDAZOLE (FLAGYL) 500 MG tablet Take 1 tablet (500 mg total) by mouth 2 (two) times daily. 01/20/14   Amy Thereasa Parkin, CNM  metroNIDAZOLE (FLAGYL) 500 MG tablet Take 1 tablet (500 mg total) by mouth 2 (two) times daily. One po bid  x 7 days 03/12/14   Patty Sermons Camprubi-Soms, PA-C   BP 162/97  Pulse 88  Temp(Src) 98.7 F (37.1 C) (Oral)  Resp 20  Ht 6' (1.829 m)  Wt 170 lb (77.111 kg)  BMI 23.05 kg/m2  SpO2 100%  LMP 03/10/2014 Physical Exam  Nursing note and vitals reviewed. Constitutional: She is oriented to person, place, and time. She appears well-developed and well-nourished. No distress.  WDWN female, tearful but in NAD. VS WNL aside from BP of 162/97  HENT:  Head: Normocephalic and atraumatic.  Mouth/Throat: Oropharynx is clear and moist and mucous membranes are normal.  Eyes: Conjunctivae and EOM are normal.  Neck: Normal range of motion. Neck supple.  Cardiovascular: Normal rate, regular rhythm, normal heart sounds and intact distal pulses.   No murmur heard. Pulmonary/Chest: Effort normal and breath sounds normal. She has no wheezes. She has no rales.  Abdominal: Soft. Normal appearance and bowel sounds are normal. She exhibits no distension and no mass. There is tenderness in the right lower quadrant, suprapubic area and left lower quadrant. There is no rebound, no guarding, no CVA tenderness, no tenderness at  McBurney's point and negative Murphy's sign.  Non-distended, +BS throughout. Mildly TTP in LLQ/RLQ/Suprapubic areas with no rebound/guarding/rigidity. Negative Murphy's or McBurney's. No CVA TTP.   Genitourinary: Pelvic exam was performed with patient supine. There is no rash, tenderness, lesion or injury on the right labia. There is no rash, tenderness, lesion or injury on the left labia. Uterus is enlarged. Uterus is not fixed and not tender. Cervix exhibits no motion tenderness, no discharge and no friability. Right adnexum displays tenderness (mild). Right adnexum displays no mass and no fullness. Left adnexum displays no mass, no tenderness and no fullness. There is bleeding around the vagina. No tenderness around the vagina. No foreign body around the vagina. No signs of injury around the  vagina. No vaginal discharge found.  External vagina free of lesions, rashes, or injury. External anus free of discharge or lesions. Moderate amount of bright red vaginal bleeding noted on exam, no vaginal discharge noted. No injury noted in vaginal vault. Mild right adnexal tenderness, no masses or fullness noted, no CMT or cervical discharge. Uterus diffusely enlarged, non-tender and mobile.  Musculoskeletal: Normal range of motion.  Neurological: She is alert and oriented to person, place, and time.  Skin: Skin is warm, dry and intact. No rash noted.  No rashes noted on all exposed surfaces  Psychiatric: Her speech is tangential. She exhibits a depressed mood. She expresses no suicidal plans and no homicidal plans.  Tearful on exam, expressing feelings of depression but denies SI/HI or any plan for either. Speech tangential in nature, jumping around and hard to redirect at times.     ED Course  Pelvic exam Date/Time: 03/12/2014 3:14 PM Performed by: CAMPRUBI-SOMS, MERCEDES STRUPP Authorized by: Corine Shelter Consent: Verbal consent obtained. Risks and benefits: risks, benefits and alternatives were discussed Consent given by: patient Patient understanding: patient states understanding of the procedure being performed Patient consent: the patient's understanding of the procedure matches consent given Procedure consent: procedure consent matches procedure scheduled Patient identity confirmed: verbally with patient Local anesthesia used: no Patient sedated: no Patient tolerance: Patient tolerated the procedure well with no immediate complications.   (including critical care time) Labs Review Labs Reviewed  WET PREP, GENITAL - Abnormal; Notable for the following:    Clue Cells Wet Prep HPF POC MODERATE (*)    WBC, Wet Prep HPF POC FEW (*)    All other components within normal limits  GC/CHLAMYDIA PROBE AMP  URINALYSIS, ROUTINE W REFLEX MICROSCOPIC  PREGNANCY, URINE   RPR  HIV ANTIBODY (ROUTINE TESTING)    Imaging Review US Transvaginal Non-ob  03/12/2014   CLINICAL DATA:  Lower abdominal pain, possible ovarian cyst, heavy bleeding with menses and spotting in between  EXAM: TRANSABDOMINAL AND TRANSVAGINAL ULTRASOUND OF PELVIS  TECHNIQUE: Both transabdominal and transvaginal ultrasound examinations of the pelvis were performed. Transabdominal technique was performed for global imaging of the pelvis including uterus, ovaries, adnexal regions, and pelvic cul-de-sac. It was necessary to proceed with endovaginal exam following the transabdominal exam to visualize the uterus and endometrium.  COMPARISON:  No prior studies available.  FINDINGS: Uterus  Measurements: 11.5 x 8.3 x 10.0 cm. Uterus is enlarged and heterogeneous with numerous hypoechoic to isoechoic mass in. The largest is a heterogeneous subendometrial mass in the right body measuring 5 cm. More inferiorly on the right there is a 3 cm mass. In the right fundus there is a 2 cm mass and in the left body of the uterus there is 0.25 cm mass.  Endometrium  Thickness: 7 mm. Many of the fibroids are in a sub endometrial location.  Right ovary  Measurements: 38 x 28 x 24 mm. Normal appearance/no adnexal mass.  Left ovary  Measurements: 35 x 16 x 15 mm. Normal appearance/no adnexal mass.  Other findings  No free fluid.  IMPRESSION: Enlarged heterogeneous uterus with multiple masses. The appearance is most consistent with numerous fibroids. Many of the uterine is subendometrial location which may contribute to bleeding. Some of these are indistinguishable from the endometrium and the possibility of endometrial based mass is technically not excluded. Optimal delineation of the above findings could be performed with MRI of the pelvis.   Electronically Signed   By: Skipper Cliche M.D.   On: 03/12/2014 16:14   US Pelvis Complete  03/12/2014   CLINICAL DATA:  Lower abdominal pain, possible ovarian cyst, heavy bleeding with  menses and spotting in between  EXAM: TRANSABDOMINAL AND TRANSVAGINAL ULTRASOUND OF PELVIS  TECHNIQUE: Both transabdominal and transvaginal ultrasound examinations of the pelvis were performed. Transabdominal technique was performed for global imaging of the pelvis including uterus, ovaries, adnexal regions, and pelvic cul-de-sac. It was necessary to proceed with endovaginal exam following the transabdominal exam to visualize the uterus and endometrium.  COMPARISON:  No prior studies available.  FINDINGS: Uterus  Measurements: 11.5 x 8.3 x 10.0 cm. Uterus is enlarged and heterogeneous with numerous hypoechoic to isoechoic mass in. The largest is a heterogeneous subendometrial mass in the right body measuring 5 cm. More inferiorly on the right there is a 3 cm mass. In the right fundus there is a 2 cm mass and in the left body of the uterus there is 0.25 cm mass.  Endometrium  Thickness: 7 mm. Many of the fibroids are in a sub endometrial location.  Right ovary  Measurements: 38 x 28 x 24 mm. Normal appearance/no adnexal mass.  Left ovary  Measurements: 35 x 16 x 15 mm. Normal appearance/no adnexal mass.  Other findings  No free fluid.  IMPRESSION: Enlarged heterogeneous uterus with multiple masses. The appearance is most consistent with numerous fibroids. Many of the uterine is subendometrial location which may contribute to bleeding. Some of these are indistinguishable from the endometrium and the possibility of endometrial based mass is technically not excluded. Optimal delineation of the above findings could be performed with MRI of the pelvis.   Electronically Signed   By: Skipper Cliche M.D.   On: 03/12/2014 16:14     EKG Interpretation None      MDM   Final diagnoses:  BV (bacterial vaginosis)  Uterine leiomyoma, unspecified location    Sara Yates is a 43 y.o. female with an extensive psych hx and remote STI hx presenting today with concerns of STI exposure and vaginal discomfort x 3 days.  DDx includes pregnancy, UTI, PID, uterine fibroids, ovarian cysts, or STI. Low clinical suspicion for appendicitis at this time. Will perform pelvic and obtain STD panel, UPreg and U/A. Pt tearful but not suicidal or homicidal at this time, encouraged to see her usual therapist to discuss her sadness. Also, pt aware of HTN, has discussed with PCP regarding medication control. Will have her follow up with PCP for medication regimen. Asymptomatic at this time.  3:15 PM Pelvic exam remarkable for right adnexal tenderness and enlarged uterus, no signs of infection noted on exam. Will obtain pelvic U/S to eval for ovarian cysts or uterine fibroids. U/A unremarkable, UPreg negative. Awaiting wet prep.  3:31 PM Wet prep demonstrating  moderate clue cells and few WBC. No yeast or trich. Will give rx for Flagyl upon d/c.   4:35 PM Transvaginal U/S demonstrating fibroid uterus which could account for her lower abd pain. At this time, given no discharge noted on pelvic, don't feel the need to treat for GC/CT today. Will let swab result and advised pt that if the result was abnormal she would get a phone call back. Pt understand and agrees with plan. Stable at d/c  BP 162/97  Pulse 88  Temp(Src) 98.7 F (37.1 C) (Oral)  Resp 20  Ht 6' (1.829 m)  Wt 170 lb (77.111 kg)  BMI 23.05 kg/m2  SpO2 100%  LMP 03/10/2014  Patty Sermons Camprubi-Soms, PA-C 03/12/14 1641

## 2014-03-14 LAB — GC/CHLAMYDIA PROBE AMP
CT Probe RNA: NEGATIVE
GC Probe RNA: NEGATIVE

## 2014-03-14 NOTE — ED Provider Notes (Signed)
Medical screening examination/treatment/procedure(s) were performed by non-physician practitioner and as supervising physician I was immediately available for consultation/collaboration.   EKG Interpretation None       Babette Relic, MD 03/14/14 [phone]

## 2014-04-29 ENCOUNTER — Encounter (HOSPITAL_BASED_OUTPATIENT_CLINIC_OR_DEPARTMENT_OTHER): Payer: Self-pay | Admitting: Emergency Medicine

## 2014-04-29 ENCOUNTER — Emergency Department (HOSPITAL_BASED_OUTPATIENT_CLINIC_OR_DEPARTMENT_OTHER): Payer: Medicaid Other

## 2014-04-29 ENCOUNTER — Emergency Department (HOSPITAL_BASED_OUTPATIENT_CLINIC_OR_DEPARTMENT_OTHER)
Admission: EM | Admit: 2014-04-29 | Discharge: 2014-04-29 | Disposition: A | Discharge status: Home / Self Care | Payer: Medicaid Other | Attending: Emergency Medicine | Admitting: Emergency Medicine

## 2014-04-29 DIAGNOSIS — F209 Schizophrenia, unspecified: Secondary | ICD-10-CM | POA: Insufficient documentation

## 2014-04-29 DIAGNOSIS — A499 Bacterial infection, unspecified: Secondary | ICD-10-CM | POA: Diagnosis not present

## 2014-04-29 DIAGNOSIS — Z91040 Latex allergy status: Secondary | ICD-10-CM | POA: Insufficient documentation

## 2014-04-29 DIAGNOSIS — M25519 Pain in unspecified shoulder: Secondary | ICD-10-CM | POA: Diagnosis not present

## 2014-04-29 DIAGNOSIS — D649 Anemia, unspecified: Secondary | ICD-10-CM | POA: Insufficient documentation

## 2014-04-29 DIAGNOSIS — N760 Acute vaginitis: Secondary | ICD-10-CM | POA: Diagnosis not present

## 2014-04-29 DIAGNOSIS — Z87891 Personal history of nicotine dependence: Secondary | ICD-10-CM | POA: Insufficient documentation

## 2014-04-29 DIAGNOSIS — M25511 Pain in right shoulder: Secondary | ICD-10-CM

## 2014-04-29 DIAGNOSIS — F319 Bipolar disorder, unspecified: Secondary | ICD-10-CM | POA: Insufficient documentation

## 2014-04-29 DIAGNOSIS — Z8739 Personal history of other diseases of the musculoskeletal system and connective tissue: Secondary | ICD-10-CM | POA: Insufficient documentation

## 2014-04-29 DIAGNOSIS — B9689 Other specified bacterial agents as the cause of diseases classified elsewhere: Secondary | ICD-10-CM | POA: Insufficient documentation

## 2014-04-29 DIAGNOSIS — F909 Attention-deficit hyperactivity disorder, unspecified type: Secondary | ICD-10-CM | POA: Insufficient documentation

## 2014-04-29 DIAGNOSIS — Z79899 Other long term (current) drug therapy: Secondary | ICD-10-CM | POA: Insufficient documentation

## 2014-04-29 DIAGNOSIS — Z3202 Encounter for pregnancy test, result negative: Secondary | ICD-10-CM | POA: Insufficient documentation

## 2014-04-29 LAB — URINALYSIS, ROUTINE W REFLEX MICROSCOPIC
Bilirubin Urine: NEGATIVE
Glucose, UA: NEGATIVE mg/dL
Hgb urine dipstick: NEGATIVE
KETONES UR: NEGATIVE mg/dL
LEUKOCYTES UA: NEGATIVE
NITRITE: NEGATIVE
PH: 6 (ref 5.0–8.0)
Protein, ur: NEGATIVE mg/dL
Specific Gravity, Urine: 1.024 (ref 1.005–1.030)
Urobilinogen, UA: 0.2 mg/dL (ref 0.0–1.0)

## 2014-04-29 LAB — WET PREP, GENITAL
Trich, Wet Prep: NONE SEEN
Yeast Wet Prep HPF POC: NONE SEEN

## 2014-04-29 LAB — PREGNANCY, URINE: Preg Test, Ur: NEGATIVE

## 2014-04-29 MED ORDER — ACETAMINOPHEN 500 MG PO TABS
1000.0000 mg | ORAL_TABLET | Freq: Once | ORAL | Status: AC
  Administered 2014-04-29: 1000 mg via ORAL
  Filled 2014-04-29: qty 2

## 2014-04-29 MED ORDER — METRONIDAZOLE 1 % EX GEL: Freq: Every day | CUTANEOUS | Status: DC

## 2014-04-29 MED ORDER — METRONIDAZOLE 500 MG PO TABS: 500.0000 mg | ORAL_TABLET | Freq: Two times a day (BID) | ORAL | Status: DC

## 2014-04-29 NOTE — ED Provider Notes (Signed)
CSN: [id number]     Arrival date & time 04/29/14  1754 History   This chart was scribed for Ephraim Hamburger, MD by Einar Pheasant, ED Scribe. This patient was seen in room MH08/MH08 and the patient's care was started at 6:40 PM.     Chief Complaint  Patient presents with  . Shoulder Pain   The history is provided by the patient. No language interpreter was used.   HPI Comments: Sara Yates is a 43 y.o. female who presents to the Emergency Department complaining of worsening constant bilateral shoulder pain that started approximately 2 months ago. Pt states that the pain is "excruciating". She states that her job causes her to do a lot of clicking with her fingers, which is causing right elbow pain and shoulder pain. Pt states that the pain tends to wake her up out of her sleep. She states that 2 weeks ago she was seen at [facility] and was prescribed Prednisone, with no relief. Sara Yates states that the pain is localized to her right shoulder, but it occasionally presents to her left side. Pt reports that the pain radiates up her right clavicle. She states that the pain is so bad that she is having a hard time brushing her hair, or extend her right arm above her head. Pt states that she is unable to be seen by a local orthopaedist because she does not have a PCP who can give her a referral to her. Sara Yates has been trying to do exercises to relieve her pain, but it has not helped. Pt is also experiencing some paraesthesia localized to her fingertips. She also reports taking Naproxen and hydrocodone with not relief. Applying hot compresses also did not help with her pain. Denies any cough, SOB, chest pain, joint swelling, chills, or fever.   Pt is also complaining of Vaginal discharge. She states that she has a history of Bacteria vaginitis. About one weeks ago she was prescribed flagyll, but the symptoms have not been alleviated. Sara Yates states that for the past 2 days she noticed some "cottage cheese" like  discharge. Denies any fever or chills.   Past Medical History  Diagnosis Date  . Bipolar 1 disorder   . PTSD (post-traumatic stress disorder)   . ADHD (attention deficit hyperactivity disorder)   . Schizophrenia   . OCD (obsessive compulsive disorder)   . Panic   . Anemia   . Arthritis     degernative    Past Surgical History  Procedure Laterality Date  . Ganglion cyst excision    . Dilation and curettage of uterus     Family History  Problem Relation Age of Onset  . Cancer Mother   . Cancer Father    History  Substance Use Topics  . Smoking status: Former Smoker    Types: Cigars  . Smokeless tobacco: Never Used  . Alcohol Use: Yes     Comment: occasionally   OB History   Grav Para Term Preterm Abortions TAB SAB Ect Mult Living   14 5 3 2 9 5 4   5      Review of Systems  Constitutional: Negative for fever and chills.  Respiratory: Negative for cough and shortness of breath.   Cardiovascular: Negative for chest pain and leg swelling.  Gastrointestinal: Negative for nausea, vomiting and abdominal pain.  Genitourinary: Positive for vaginal discharge.  Musculoskeletal: Positive for arthralgias.  All other systems reviewed and are negative.  Allergies  Latex  Home Medications  Prior to Admission medications   Medication Sig Start Date End Date Taking? Authorizing Provider  asenapine (SAPHRIS) 5 MG SUBL 24 hr tablet Place 5 mg under the tongue 2 (two) times daily.    Historical Provider, MD  etonogestrel-ethinyl estradiol (NUVARING) 0.12-0.015 MG/24HR vaginal ring Insert vaginally and leave in place for 3 consecutive weeks, then remove for 1 week. 01/11/14   Amy Thereasa Parkin, CNM  ferrous sulfate 325 (65 FE) MG tablet Take 325 mg by mouth daily with breakfast.    Historical Provider, MD  fluconazole (DIFLUCAN) 150 MG tablet Take 1 tablet (150 mg total) by mouth once. 01/11/14   Amy Thereasa Parkin, CNM  FLUoxetine (PROZAC) 40 MG capsule Take 30 mg by mouth daily.     Historical Provider, MD  lamoTRIgine (LAMICTAL) 100 MG tablet Take 100 mg by mouth daily.    Historical Provider, MD  lisdexamfetamine (VYVANSE) 50 MG capsule Take 50 mg by mouth every morning.    Historical Provider, MD  methylphenidate (RITALIN) 10 MG tablet Take 10 mg by mouth 2 (two) times daily.    Historical Provider, MD  metroNIDAZOLE (FLAGYL) 500 MG tablet Take 1 tablet (500 mg total) by mouth 2 (two) times daily. 01/20/14   Amy Thereasa Parkin, CNM  metroNIDAZOLE (FLAGYL) 500 MG tablet Take 1 tablet (500 mg total) by mouth 2 (two) times daily. One po bid x 7 days 03/12/14   Patty Sermons Camprubi-Soms, PA-C  QUEtiapine (SEROQUEL) 100 MG tablet Take 100 mg by mouth at bedtime.    Historical Provider, MD   BP 156/107  Pulse 79  Temp(Src) 98.1 F (36.7 C) (Oral)  Resp 20  Ht 6' (1.829 m)  Wt 180 lb (81.647 kg)  BMI 24.41 kg/m2  SpO2 100%  LMP 04/07/2014  Physical Exam  Nursing note and vitals reviewed. Constitutional: She is oriented to person, place, and time. She appears well-developed and well-nourished. No distress.  HENT:  Head: Normocephalic and atraumatic.  Right Ear: External ear normal.  Left Ear: External ear normal.  Neck: Neck supple.  No neck tenderness or spasm  Cardiovascular: Normal rate, regular rhythm, normal heart sounds and intact distal pulses.   Pulmonary/Chest: Effort normal and breath sounds normal. No respiratory distress.  Abdominal: She exhibits no distension. There is no tenderness.  Genitourinary: Uterus is not enlarged and not tender. Cervix exhibits no motion tenderness. Vaginal discharge found.  Musculoskeletal: Normal range of motion.  Mild pain with ROM of the right shoulder. No specific tenderness. Neurovascularly intact. No deformities or trauma  Neurological: She is alert and oriented to person, place, and time.  Skin: Skin is warm and dry.  Psychiatric: She has a normal mood and affect. Her behavior is normal.    ED Course  Procedures  (including critical care time)  DIAGNOSTIC STUDIES: Oxygen Saturation is 100% on RA, normal by my interpretation.    COORDINATION OF CARE: 6:56 PM- Pt advised of plan for treatment and pt agrees.  Labs Review Labs Reviewed  WET PREP, GENITAL - Abnormal; Notable for the following:    Clue Cells Wet Prep HPF POC MODERATE (*)    WBC, Wet Prep HPF POC FEW (*)    All other components within normal limits  GC/CHLAMYDIA PROBE AMP  URINALYSIS, ROUTINE W REFLEX MICROSCOPIC  PREGNANCY, URINE    Imaging Review Dg Shoulder Right  04/29/2014   CLINICAL DATA:  42 year old female with right shoulder pain. No known injury.  EXAM: RIGHT SHOULDER - 2+ VIEW  COMPARISON:  None.  FINDINGS: There is no evidence of fracture or dislocation. There is no evidence of arthropathy or other focal bone abnormality. Soft tissues are unremarkable.  IMPRESSION: Negative.   Electronically Signed   By: Hassan Rowan M.D.   On: 04/29/2014 19:32     EKG Interpretation None      MDM   Final diagnoses:  Right shoulder pain  BV (bacterial vaginosis)    No obvious etiology for patient's chronic right shoulder pain. Likely muscular in etiology, possibly rotator cuff related. At this time we'll recommend NSAIDs and Tylenol. We'll give her a referral to ortho although she may need her PCP to refer her. She does appear to have BV on her wet prep. Patient has no concerns for other STDs. No evidence of cervicitis. At this time we'll treat her BV including MetroGel, which patient requesting. We'll have her follow up with gynecology.  I personally performed the services described in this documentation, which was scribed in my presence. The recorded information has been reviewed and is accurate.    Ephraim Hamburger, MD 04/30/14 Dyann Kief

## 2014-04-29 NOTE — Discharge Instructions (Signed)
Bacterial Vaginosis Bacterial vaginosis is a vaginal infection that occurs when the normal balance of bacteria in the vagina is disrupted. It results from an overgrowth of certain bacteria. This is the most common vaginal infection in women of childbearing age. Treatment is important to prevent complications, especially in pregnant women, as it can cause a premature delivery. CAUSES  Bacterial vaginosis is caused by an increase in harmful bacteria that are normally present in smaller amounts in the vagina. Several different kinds of bacteria can cause bacterial vaginosis. However, the reason that the condition develops is not fully understood. RISK FACTORS Certain activities or behaviors can put you at an increased risk of developing bacterial vaginosis, including:  Having a new sex partner or multiple sex partners.  Douching.  Using an intrauterine device (IUD) for contraception. Women do not get bacterial vaginosis from toilet seats, bedding, swimming pools, or contact with objects around them. SIGNS AND SYMPTOMS  Some women with bacterial vaginosis have no signs or symptoms. Common symptoms include:  Grey vaginal discharge.  A fishlike odor with discharge, especially after sexual intercourse.  Itching or burning of the vagina and vulva.  Burning or pain with urination. DIAGNOSIS  Your health care provider will take a medical history and examine the vagina for signs of bacterial vaginosis. A sample of vaginal fluid may be taken. Your health care provider will look at this sample under a microscope to check for bacteria and abnormal cells. A vaginal pH test may also be done.  TREATMENT  Bacterial vaginosis may be treated with antibiotic medicines. These may be given in the form of a pill or a vaginal cream. A second round of antibiotics may be prescribed if the condition comes back after treatment.  HOME CARE INSTRUCTIONS   Only take over-the-counter or prescription medicines as  directed by your health care provider.  If antibiotic medicine was prescribed, take it as directed. Make sure you finish it even if you start to feel better.  Do not have sex until treatment is completed.  Tell all sexual partners that you have a vaginal infection. They should see their health care provider and be treated if they have problems, such as a mild rash or itching.  Practice safe sex by using condoms and only having one sex partner. SEEK MEDICAL CARE IF:   Your symptoms are not improving after 3 days of treatment.  You have increased discharge or pain.  You have a fever. MAKE SURE YOU:   Understand these instructions.  Will watch your condition.  Will get help right away if you are not doing well or get worse. FOR MORE INFORMATION  Centers for Disease Control and Prevention, Division of STD Prevention: [employer] American Sexual Health Association (ASHA): www.ashastd.org  Document Released: 09/09/2005 Document Revised: 06/30/2013 Document Reviewed: 04/21/2013 [facility] Patient Information 2015 [place], [place]. This information is not intended to replace advice given to you by your health care provider. Make sure you discuss any questions you have with your health care provider.    Shoulder Exercises EXERCISES  RANGE OF MOTION (ROM) AND STRETCHING EXERCISES These exercises may help you when beginning to rehabilitate your injury. Your symptoms may resolve with or without further involvement from your physician, physical therapist or athletic trainer. While completing these exercises, remember:   Restoring tissue flexibility helps normal motion to return to the joints. This allows healthier, less painful movement and activity.  An effective stretch should be held for at least 30 seconds.  A stretch should never  be painful. You should only feel a gentle lengthening or release in the stretched tissue. ROM - Pendulum  Bend at the waist so that your right / left arm  falls away from your body. Support yourself with your opposite hand on a solid surface, such as a table or a countertop.  Your right / left arm should be perpendicular to the ground. If it is not perpendicular, you need to lean over farther. Relax the muscles in your right / left arm and shoulder as much as possible.  Gently sway your hips and trunk so they move your right / left arm without any use of your right / left shoulder muscles.  Progress your movements so that your right / left arm moves side to side, then forward and backward, and finally, both clockwise and counterclockwise.  Complete __________ repetitions in each direction. Many people use this exercise to relieve discomfort in their shoulder as well as to gain range of motion. Repeat __________ times. Complete this exercise __________ times per day. STRETCH - Flexion, Standing  Stand with good posture. With an underhand grip on your right / left hand and an overhand grip on the opposite hand, grasp a broomstick or cane so that your hands are a little more than shoulder-width apart.  Keeping your right / left elbow straight and shoulder muscles relaxed, push the stick with your opposite hand to raise your right / left arm in front of your body and then overhead. Raise your arm until you feel a stretch in your right / left shoulder, but before you have increased shoulder pain.  Try to avoid shrugging your right / left shoulder as your arm rises by keeping your shoulder blade tucked down and toward your mid-back spine. Hold __________ seconds.  Slowly return to the starting position. Repeat __________ times. Complete this exercise __________ times per day. STRETCH - Internal Rotation  Place your right / left hand behind your back, palm-up.  Throw a towel or belt over your opposite shoulder. Grasp the towel/belt with your right / left hand.  While keeping an upright posture, gently pull up on the towel/belt until you feel a  stretch in the front of your right / left shoulder.  Avoid shrugging your right / left shoulder as your arm rises by keeping your shoulder blade tucked down and toward your mid-back spine.  Hold __________. Release the stretch by lowering your opposite hand. Repeat __________ times. Complete this exercise __________ times per day. STRETCH - External Rotation and Abduction  Stagger your stance through a doorframe. It does not matter which foot is forward.  As instructed by your physician, physical therapist or athletic trainer, place your hands:  And forearms above your head and on the door frame.  And forearms at head-height and on the door frame.  At elbow-height and on the door frame.  Keeping your head and chest upright and your stomach muscles tight to prevent over-extending your low-back, slowly shift your weight onto your front foot until you feel a stretch across your chest and/or in the front of your shoulders.  Hold __________ seconds. Shift your weight to your back foot to release the stretch. Repeat __________ times. Complete this stretch __________ times per day.  STRENGTHENING EXERCISES  These exercises may help you when beginning to rehabilitate your injury. They may resolve your symptoms with or without further involvement from your physician, physical therapist or athletic trainer. While completing these exercises, remember:   Muscles can gain both  the endurance and the strength needed for everyday activities through controlled exercises.  Complete these exercises as instructed by your physician, physical therapist or athletic trainer. Progress the resistance and repetitions only as guided.  You may experience muscle soreness or fatigue, but the pain or discomfort you are trying to eliminate should never worsen during these exercises. If this pain does worsen, stop and make certain you are following the directions exactly. If the pain is still present after adjustments,  discontinue the exercise until you can discuss the trouble with your clinician.  If advised by your physician, during your recovery, avoid activity or exercises which involve actions that place your right / left hand or elbow above your head or behind your back or head. These positions stress the tissues which are trying to heal. STRENGTH - Scapular Depression and Adduction  With good posture, sit on a firm chair. Supported your arms in front of you with pillows, arm rests or a table top. Have your elbows in line with the sides of your body.  Gently draw your shoulder blades down and toward your mid-back spine. Gradually increase the tension without tensing the muscles along the top of your shoulders and the back of your neck.  Hold for __________ seconds. Slowly release the tension and relax your muscles completely before completing the next repetition.  After you have practiced this exercise, remove the arm support and complete it in standing as well as sitting. Repeat __________ times. Complete this exercise __________ times per day.  STRENGTH - External Rotators  Secure a rubber exercise band/tubing to a fixed object so that it is at the same height as your right / left elbow when you are standing or sitting on a firm surface.  Stand or sit so that the secured exercise band/tubing is at your side that is not injured.  Bend your elbow 90 degrees. Place a folded towel or small pillow under your right / left arm so that your elbow is a few inches away from your side.  Keeping the tension on the exercise band/tubing, pull it away from your body, as if pivoting on your elbow. Be sure to keep your body steady so that the movement is only coming from your shoulder rotating.  Hold __________ seconds. Release the tension in a controlled manner as you return to the starting position. Repeat __________ times. Complete this exercise __________ times per day.  STRENGTH - Supraspinatus  Stand or sit  with good posture. Grasp a __________ weight or an exercise band/tubing so that your hand is "thumbs-up," like when you shake hands.  Slowly lift your right / left hand from your thigh into the air, traveling about 30 degrees from straight out at your side. Lift your hand to shoulder height or as far as you can without increasing any shoulder pain. Initially, many people do not lift their hands above shoulder height.  Avoid shrugging your right / left shoulder as your arm rises by keeping your shoulder blade tucked down and toward your mid-back spine.  Hold for __________ seconds. Control the descent of your hand as you slowly return to your starting position. Repeat __________ times. Complete this exercise __________ times per day.  STRENGTH - Shoulder Extensors  Secure a rubber exercise band/tubing so that it is at the height of your shoulders when you are either standing or sitting on a firm arm-less chair.  With a thumbs-up grip, grasp an end of the band/tubing in each hand. Straighten your elbows  and lift your hands straight in front of you at shoulder height. Step back away from the secured end of band/tubing until it becomes tense.  Squeezing your shoulder blades together, pull your hands down to the sides of your thighs. Do not allow your hands to go behind you.  Hold for __________ seconds. Slowly ease the tension on the band/tubing as you reverse the directions and return to the starting position. Repeat __________ times. Complete this exercise __________ times per day.  STRENGTH - Scapular Retractors  Secure a rubber exercise band/tubing so that it is at the height of your shoulders when you are either standing or sitting on a firm arm-less chair.  With a palm-down grip, grasp an end of the band/tubing in each hand. Straighten your elbows and lift your hands straight in front of you at shoulder height. Step back away from the secured end of band/tubing until it becomes  tense.  Squeezing your shoulder blades together, draw your elbows back as you bend them. Keep your upper arm lifted away from your body throughout the exercise.  Hold __________ seconds. Slowly ease the tension on the band/tubing as you reverse the directions and return to the starting position. Repeat __________ times. Complete this exercise __________ times per day. STRENGTH - Scapular Depressors  Find a sturdy chair without wheels, such as a from a dining room table.  Keeping your feet on the floor, lift your bottom from the seat and lock your elbows.  Keeping your elbows straight, allow gravity to pull your body weight down. Your shoulders will rise toward your ears.  Raise your body against gravity by drawing your shoulder blades down your back, shortening the distance between your shoulders and ears. Although your feet should always maintain contact with the floor, your feet should progressively support less body weight as you get stronger.  Hold __________ seconds. In a controlled and slow manner, lower your body weight to begin the next repetition. Repeat __________ times. Complete this exercise __________ times per day.  Document Released: 07/24/2005 Document Revised: 12/02/2011 Document Reviewed: 12/22/2008 [facility] Patient Information 2015 [place], [place]. This information is not intended to replace advice given to you by your health care provider. Make sure you discuss any questions you have with your health care provider.    Shoulder Pain The shoulder is the joint that connects your arms to your body. The bones that form the shoulder joint include the upper arm bone (humerus), the shoulder blade (scapula), and the collarbone (clavicle). The top of the humerus is shaped like a ball and fits into a rather flat socket on the scapula (glenoid cavity). A combination of muscles and strong, fibrous tissues that connect muscles to bones (tendons) support your shoulder joint and hold the  ball in the socket. Small, fluid-filled sacs (bursae) are located in different areas of the joint. They act as cushions between the bones and the overlying soft tissues and help reduce friction between the gliding tendons and the bone as you move your arm. Your shoulder joint allows a wide range of motion in your arm. This range of motion allows you to do things like scratch your back or throw a ball. However, this range of motion also makes your shoulder more prone to pain from overuse and injury. Causes of shoulder pain can originate from both injury and overuse and usually can be grouped in the following four categories:  Redness, swelling, and pain (inflammation) of the tendon (tendinitis) or the bursae (bursitis).  Instability,  such as a dislocation of the joint.  Inflammation of the joint (arthritis).  Broken bone (fracture). HOME CARE INSTRUCTIONS   Apply ice to the sore area.  Put ice in a plastic bag.  Place a towel between your skin and the bag.  Leave the ice on for 15-20 minutes, 3-4 times per day for the first 2 days, or as directed by your health care provider.  Stop using cold packs if they do not help with the pain.  If you have a shoulder sling or immobilizer, wear it as long as your caregiver instructs. Only remove it to shower or bathe. Move your arm as little as possible, but keep your hand moving to prevent swelling.  Squeeze a soft ball or foam pad as much as possible to help prevent swelling.  Only take over-the-counter or prescription medicines for pain, discomfort, or fever as directed by your caregiver. SEEK MEDICAL CARE IF:   Your shoulder pain increases, or new pain develops in your arm, hand, or fingers.  Your hand or fingers become cold and numb.  Your pain is not relieved with medicines. SEEK IMMEDIATE MEDICAL CARE IF:   Your arm, hand, or fingers are numb or tingling.  Your arm, hand, or fingers are significantly swollen or turn white or  blue. MAKE SURE YOU:   Understand these instructions.  Will watch your condition.  Will get help right away if you are not doing well or get worse. Document Released: 06/19/2005 Document Revised: 01/24/2014 Document Reviewed: 08/24/2011 [facility] Patient Information 2015 [place], [place]. This information is not intended to replace advice given to you by your health care provider. Make sure you discuss any questions you have with your health care provider.

## 2014-04-29 NOTE — ED Notes (Signed)
Pain in shoulders x2 months radiating down arms.  Was seen in urgent care 2 weeks ago and was told to see ortho.  Has not seen them.  Also sts she was seen here a month ago and diagnosed with BV but it has not "cleared up".  Would like to be seen for that, too.

## 2014-04-30 LAB — GC/CHLAMYDIA PROBE AMP
CT Probe RNA: NEGATIVE
GC PROBE AMP APTIMA: NEGATIVE

## 2014-05-02 ENCOUNTER — Ambulatory Visit: Payer: Medicaid Other | Admitting: Obstetrics & Gynecology

## 2014-05-23 ENCOUNTER — Telehealth (HOSPITAL_BASED_OUTPATIENT_CLINIC_OR_DEPARTMENT_OTHER): Payer: Self-pay | Admitting: Emergency Medicine

## 2014-05-30 ENCOUNTER — Encounter (HOSPITAL_BASED_OUTPATIENT_CLINIC_OR_DEPARTMENT_OTHER): Payer: Self-pay | Admitting: Emergency Medicine

## 2014-05-30 ENCOUNTER — Emergency Department (HOSPITAL_BASED_OUTPATIENT_CLINIC_OR_DEPARTMENT_OTHER): Payer: Medicaid Other

## 2014-05-30 ENCOUNTER — Other Ambulatory Visit (HOSPITAL_BASED_OUTPATIENT_CLINIC_OR_DEPARTMENT_OTHER): Payer: Medicaid Other

## 2014-05-30 ENCOUNTER — Emergency Department (HOSPITAL_BASED_OUTPATIENT_CLINIC_OR_DEPARTMENT_OTHER)
Admission: EM | Admit: 2014-05-30 | Discharge: 2014-05-30 | Disposition: A | Discharge status: Other Acute Inpatient Hospital | Payer: Medicaid Other | Source: Home / Self Care | Attending: Emergency Medicine | Admitting: Emergency Medicine

## 2014-05-30 ENCOUNTER — Encounter (HOSPITAL_COMMUNITY): Payer: Self-pay | Admitting: Family Medicine

## 2014-05-30 ENCOUNTER — Observation Stay (HOSPITAL_COMMUNITY)
Admission: AD | Admit: 2014-05-30 | Discharge: 2014-06-02 | Disposition: A | Discharge status: Home / Self Care | Payer: Medicaid Other | Source: Ambulatory Visit | Attending: Family Medicine | Admitting: Family Medicine

## 2014-05-30 DIAGNOSIS — F319 Bipolar disorder, unspecified: Secondary | ICD-10-CM | POA: Insufficient documentation

## 2014-05-30 DIAGNOSIS — Z3202 Encounter for pregnancy test, result negative: Secondary | ICD-10-CM

## 2014-05-30 DIAGNOSIS — Z87891 Personal history of nicotine dependence: Secondary | ICD-10-CM

## 2014-05-30 DIAGNOSIS — N949 Unspecified condition associated with female genital organs and menstrual cycle: Secondary | ICD-10-CM

## 2014-05-30 DIAGNOSIS — F4310 Post-traumatic stress disorder, unspecified: Secondary | ICD-10-CM | POA: Insufficient documentation

## 2014-05-30 DIAGNOSIS — Z792 Long term (current) use of antibiotics: Secondary | ICD-10-CM | POA: Insufficient documentation

## 2014-05-30 DIAGNOSIS — Z8739 Personal history of other diseases of the musculoskeletal system and connective tissue: Secondary | ICD-10-CM

## 2014-05-30 DIAGNOSIS — Z79899 Other long term (current) drug therapy: Secondary | ICD-10-CM | POA: Insufficient documentation

## 2014-05-30 DIAGNOSIS — F429 Obsessive-compulsive disorder, unspecified: Secondary | ICD-10-CM | POA: Diagnosis not present

## 2014-05-30 DIAGNOSIS — F17200 Nicotine dependence, unspecified, uncomplicated: Secondary | ICD-10-CM

## 2014-05-30 DIAGNOSIS — F909 Attention-deficit hyperactivity disorder, unspecified type: Secondary | ICD-10-CM

## 2014-05-30 DIAGNOSIS — F209 Schizophrenia, unspecified: Secondary | ICD-10-CM | POA: Diagnosis not present

## 2014-05-30 DIAGNOSIS — Z91040 Latex allergy status: Secondary | ICD-10-CM | POA: Insufficient documentation

## 2014-05-30 DIAGNOSIS — N938 Other specified abnormal uterine and vaginal bleeding: Secondary | ICD-10-CM | POA: Diagnosis not present

## 2014-05-30 DIAGNOSIS — F3162 Bipolar disorder, current episode mixed, moderate: Secondary | ICD-10-CM | POA: Insufficient documentation

## 2014-05-30 DIAGNOSIS — F410 Panic disorder [episodic paroxysmal anxiety] without agoraphobia: Secondary | ICD-10-CM | POA: Insufficient documentation

## 2014-05-30 DIAGNOSIS — D62 Acute posthemorrhagic anemia: Secondary | ICD-10-CM | POA: Diagnosis present

## 2014-05-30 DIAGNOSIS — N925 Other specified irregular menstruation: Principal | ICD-10-CM | POA: Insufficient documentation

## 2014-05-30 DIAGNOSIS — I10 Essential (primary) hypertension: Secondary | ICD-10-CM | POA: Diagnosis present

## 2014-05-30 DIAGNOSIS — M129 Arthropathy, unspecified: Secondary | ICD-10-CM | POA: Diagnosis not present

## 2014-05-30 DIAGNOSIS — N920 Excessive and frequent menstruation with regular cycle: Secondary | ICD-10-CM | POA: Diagnosis present

## 2014-05-30 DIAGNOSIS — D649 Anemia, unspecified: Secondary | ICD-10-CM | POA: Insufficient documentation

## 2014-05-30 DIAGNOSIS — N898 Other specified noninflammatory disorders of vagina: Secondary | ICD-10-CM | POA: Insufficient documentation

## 2014-05-30 DIAGNOSIS — N939 Abnormal uterine and vaginal bleeding, unspecified: Secondary | ICD-10-CM | POA: Diagnosis present

## 2014-05-30 DIAGNOSIS — D259 Leiomyoma of uterus, unspecified: Secondary | ICD-10-CM | POA: Diagnosis present

## 2014-05-30 DIAGNOSIS — Z86018 Personal history of other benign neoplasm: Secondary | ICD-10-CM

## 2014-05-30 HISTORY — DX: Cannabis use, unspecified, uncomplicated: F12.90

## 2014-05-30 HISTORY — DX: Nicotine dependence, unspecified, uncomplicated: F17.200

## 2014-05-30 HISTORY — DX: Other problems related to lifestyle: Z72.89

## 2014-05-30 HISTORY — DX: Other specified health status: Z78.9

## 2014-05-30 HISTORY — DX: Alcohol use, unspecified, uncomplicated: F10.90

## 2014-05-30 LAB — CBC WITH DIFFERENTIAL/PLATELET
BASOS ABS: 0.2 10*3/uL — AB (ref 0.0–0.1)
Band Neutrophils: 1 % (ref 0–10)
Basophils Relative: 2 % — ABNORMAL HIGH (ref 0–1)
EOS ABS: 0.2 10*3/uL (ref 0.0–0.7)
Eosinophils Relative: 2 % (ref 0–5)
HCT: 20.3 % — ABNORMAL LOW (ref 36.0–46.0)
Hemoglobin: 6.1 g/dL — CL (ref 12.0–15.0)
LYMPHS ABS: 1.9 10*3/uL (ref 0.7–4.0)
Lymphocytes Relative: 22 % (ref 12–46)
MCH: 22.4 pg — AB (ref 26.0–34.0)
MCHC: 30 g/dL (ref 30.0–36.0)
MCV: 74.6 fL — ABNORMAL LOW (ref 78.0–100.0)
MONO ABS: 0.6 10*3/uL (ref 0.1–1.0)
Monocytes Relative: 7 % (ref 3–12)
Neutro Abs: 5.6 10*3/uL (ref 1.7–7.7)
Neutrophils Relative %: 66 % (ref 43–77)
PLATELETS: 63 10*3/uL — AB (ref 150–400)
RBC: 2.72 MIL/uL — ABNORMAL LOW (ref 3.87–5.11)
RDW: 24.8 % — ABNORMAL HIGH (ref 11.5–15.5)
WBC: 8.5 10*3/uL (ref 4.0–10.5)

## 2014-05-30 LAB — BASIC METABOLIC PANEL
Anion gap: 12 (ref 5–15)
BUN: 9 mg/dL (ref 6–23)
CALCIUM: 9.5 mg/dL (ref 8.4–10.5)
CO2: 23 mEq/L (ref 19–32)
Chloride: 108 mEq/L (ref 96–112)
Creatinine, Ser: 0.6 mg/dL (ref 0.50–1.10)
Glucose, Bld: 100 mg/dL — ABNORMAL HIGH (ref 70–99)
Potassium: 3.8 mEq/L (ref 3.7–5.3)
Sodium: 143 mEq/L (ref 137–147)

## 2014-05-30 LAB — HCG, QUANTITATIVE, PREGNANCY: hCG, Beta Chain, Quant, S: <1 m[IU]/mL (ref <5)

## 2014-05-30 LAB — WET PREP, GENITAL
Clue Cells Wet Prep HPF POC: NONE SEEN
Trich, Wet Prep: NONE SEEN
Yeast Wet Prep HPF POC: NONE SEEN

## 2014-05-30 LAB — ABO/RH: ABO/RH(D): O POS

## 2014-05-30 LAB — PREPARE RBC (CROSSMATCH)

## 2014-05-30 MED ORDER — LAMOTRIGINE 100 MG PO TABS
100.0000 mg | ORAL_TABLET | Freq: Every day | ORAL | Status: DC
  Administered 2014-05-30 – 2014-06-02 (×4): 100 mg via ORAL
  Filled 2014-05-30 (×5): qty 1

## 2014-05-30 MED ORDER — MENTHOL 3 MG MT LOZG: 1.0000 | LOZENGE | OROMUCOSAL | Status: DC | PRN

## 2014-05-30 MED ORDER — HYDROCHLOROTHIAZIDE 25 MG PO TABS
25.0000 mg | ORAL_TABLET | Freq: Every day | ORAL | Status: DC
  Administered 2014-05-30 – 2014-06-02 (×4): 25 mg via ORAL
  Filled 2014-05-30 (×5): qty 1

## 2014-05-30 MED ORDER — FLUOXETINE HCL 20 MG PO CAPS
40.0000 mg | ORAL_CAPSULE | Freq: Every day | ORAL | Status: DC
  Administered 2014-05-30 – 2014-06-02 (×4): 40 mg via ORAL
  Filled 2014-05-30 (×5): qty 2

## 2014-05-30 MED ORDER — FLUOXETINE HCL 40 MG PO CAPS: 40.0000 mg | ORAL_CAPSULE | Freq: Every day | ORAL | Status: DC

## 2014-05-30 MED ORDER — MORPHINE SULFATE 4 MG/ML IJ SOLN
4.0000 mg | Freq: Once | INTRAMUSCULAR | Status: AC
  Administered 2014-05-30: 4 mg via INTRAVENOUS
  Filled 2014-05-30: qty 1

## 2014-05-30 MED ORDER — SODIUM CHLORIDE 0.9 % IV SOLN: Freq: Once | INTRAVENOUS | Status: DC

## 2014-05-30 MED ORDER — ZOLPIDEM TARTRATE 5 MG PO TABS
5.0000 mg | ORAL_TABLET | Freq: Once | ORAL | Status: AC
  Administered 2014-05-30: 5 mg via ORAL
  Filled 2014-05-30: qty 1

## 2014-05-30 MED ORDER — ACETAMINOPHEN 325 MG PO TABS
650.0000 mg | ORAL_TABLET | Freq: Once | ORAL | Status: AC
  Administered 2014-05-31: 650 mg via ORAL
  Filled 2014-05-30: qty 2

## 2014-05-30 MED ORDER — ALUM & MAG HYDROXIDE-SIMETH 200-200-20 MG/5ML PO SUSP: 30.0000 mL | ORAL | Status: DC | PRN

## 2014-05-30 MED ORDER — IBUPROFEN 600 MG PO TABS
600.0000 mg | ORAL_TABLET | Freq: Four times a day (QID) | ORAL | Status: DC | PRN
  Administered 2014-05-30 – 2014-06-01 (×3): 600 mg via ORAL
  Filled 2014-05-30 (×3): qty 1

## 2014-05-30 MED ORDER — SODIUM CHLORIDE 0.9 % IV SOLN
INTRAVENOUS | Status: DC
  Administered 2014-05-30: 19:00:00 via INTRAVENOUS

## 2014-05-30 MED ORDER — DIPHENHYDRAMINE HCL 25 MG PO CAPS
25.0000 mg | ORAL_CAPSULE | Freq: Once | ORAL | Status: DC
Start: 2014-05-30 — End: 2014-06-02

## 2014-05-30 MED ORDER — GUAIFENESIN 100 MG/5ML PO SOLN: 15.0000 mL | ORAL | Status: DC | PRN

## 2014-05-30 MED ORDER — MEGESTROL ACETATE 40 MG PO TABS
40.0000 mg | ORAL_TABLET | Freq: Three times a day (TID) | ORAL | Status: DC
  Administered 2014-05-30 – 2014-06-02 (×8): 40 mg via ORAL
  Filled 2014-05-30 (×11): qty 1

## 2014-05-30 MED ORDER — SODIUM CHLORIDE 0.9 % IV BOLUS (SEPSIS)
1000.0000 mL | Freq: Once | INTRAVENOUS | Status: AC
  Administered 2014-05-30: 1000 mL via INTRAVENOUS
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
## 2014-05-30 NOTE — ED Provider Notes (Signed)
CSN: [id number]     Arrival date & time 05/30/14  1405 History   First MD Initiated Contact with Patient 05/30/14 1419     Chief Complaint  Patient presents with  . Vaginal Bleeding     (Consider location/radiation/quality/duration/timing/severity/associated sxs/prior Treatment) HPI Comments: Patient is a 43 year old female with history of bipolar, PTSD, ADHD, schizophrenia, OCD. She presents with complaints of vaginal bleeding for the past 2 days. She states it was initially dark and now it is brighter red. She denies any chest pain, shortness of breath, lightheadedness. There is a possibility she may be pregnant.  Patient is a 43 y.o. female presenting with vaginal bleeding. The history is provided by the patient.  Vaginal Bleeding Quality:  Dark red Severity:  Moderate Onset quality:  Sudden Duration:  2 days Timing:  Constant Progression:  Worsening Chronicity:  New Menstrual history:  Regular Possible pregnancy: yes   Relieved by:  Nothing Worsened by:  Nothing tried Ineffective treatments:  None tried   Past Medical History  Diagnosis Date  . Bipolar 1 disorder   . PTSD (post-traumatic stress disorder)   . ADHD (attention deficit hyperactivity disorder)   . Schizophrenia   . OCD (obsessive compulsive disorder)   . Panic   . Anemia   . Arthritis     degernative    Past Surgical History  Procedure Laterality Date  . Ganglion cyst excision    . Dilation and curettage of uterus     Family History  Problem Relation Age of Onset  . Cancer Mother   . Cancer Father    History  Substance Use Topics  . Smoking status: Former Smoker    Types: Cigars  . Smokeless tobacco: Never Used  . Alcohol Use: Yes     Comment: occasionally   OB History   Grav Para Term Preterm Abortions TAB SAB Ect Mult Living   14 5 3 2 9 5 4   5      Review of Systems  Genitourinary: Positive for vaginal bleeding.  All other systems reviewed and are negative.     Allergies   Latex  Home Medications   Prior to Admission medications   Medication Sig Start Date End Date Taking? Authorizing Provider  asenapine (SAPHRIS) 5 MG SUBL 24 hr tablet Place 5 mg under the tongue 2 (two) times daily.    Historical Provider, MD  etonogestrel-ethinyl estradiol (NUVARING) 0.12-0.015 MG/24HR vaginal ring Insert vaginally and leave in place for 3 consecutive weeks, then remove for 1 week. 01/11/14   Amy Thereasa Parkin, CNM  ferrous sulfate 325 (65 FE) MG tablet Take 325 mg by mouth daily with breakfast.    Historical Provider, MD  fluconazole (DIFLUCAN) 150 MG tablet Take 1 tablet (150 mg total) by mouth once. 01/11/14   Amy Thereasa Parkin, CNM  FLUoxetine (PROZAC) 40 MG capsule Take 30 mg by mouth daily.    Historical Provider, MD  lamoTRIgine (LAMICTAL) 100 MG tablet Take 100 mg by mouth daily.    Historical Provider, MD  lisdexamfetamine (VYVANSE) 50 MG capsule Take 50 mg by mouth every morning.    Historical Provider, MD  methylphenidate (RITALIN) 10 MG tablet Take 10 mg by mouth 2 (two) times daily.    Historical Provider, MD  metroNIDAZOLE (FLAGYL) 500 MG tablet Take 1 tablet (500 mg total) by mouth 2 (two) times daily. 01/20/14   Amy Thereasa Parkin, CNM  metroNIDAZOLE (FLAGYL) 500 MG tablet Take 1 tablet (500 mg total) by mouth 2 (two)  times daily. One po bid x 7 days 03/12/14   Patty Sermons Camprubi-Soms, PA-C  metroNIDAZOLE (FLAGYL) 500 MG tablet Take 1 tablet (500 mg total) by mouth 2 (two) times daily. One po bid x 7 days 04/29/14   Ephraim Hamburger, MD  metroNIDAZOLE (METROGEL) 1 % gel Apply topically daily. 04/29/14   Ephraim Hamburger, MD  QUEtiapine (SEROQUEL) 100 MG tablet Take 100 mg by mouth at bedtime.    Historical Provider, MD   BP 146/82  Pulse 90  Temp(Src) 98.7 F (37.1 C) (Oral)  Resp 20  Ht 6' (1.829 m)  Wt 175 lb 9 oz (79.635 kg)  BMI 23.81 kg/m2  SpO2 100%  LMP 04/30/2014 Physical Exam  Nursing note and vitals reviewed. Constitutional: She is oriented to  person, place, and time. She appears well-developed and well-nourished. No distress.  HENT:  Head: Normocephalic and atraumatic.  Neck: Normal range of motion. Neck supple.  Cardiovascular: Normal rate and regular rhythm.  Exam reveals no gallop and no friction rub.   No murmur heard. Pulmonary/Chest: Effort normal and breath sounds normal. No respiratory distress. She has no wheezes.  Abdominal: Soft. Bowel sounds are normal. She exhibits no distension. There is no tenderness.  Genitourinary:  Pelvic exam reveals normal external genitalia. There is maroon blood within the vaginal vault.  Musculoskeletal: Normal range of motion.  Neurological: She is alert and oriented to person, place, and time.  Skin: Skin is warm and dry. She is not diaphoretic.    ED Course  Procedures (including critical care time) Labs Review Labs Reviewed  WET PREP, GENITAL  GC/CHLAMYDIA PROBE AMP  CBC WITH DIFFERENTIAL  BASIC METABOLIC PANEL  HCG, QUANTITATIVE, PREGNANCY    Imaging Review No results found.   EKG Interpretation None      MDM   Final diagnoses:  None    Patient is a 43 year old female who presents with complaints of vaginal bleeding. She had an ultrasound in June that revealed multiple fibroids. She was to have an MRI done, however this has not materialized. She presents today with three-day history of worsening bleeding and hemoglobin of 6.1. She feels weak but denies any shortness of breath, dizziness, or syncope. Pelvic exam reveals maroon colored blood in the vaginal vault and she continues to bleed. I've discussed the case with Dr. Kennon Rounds at [facility] who agrees to accept the patient in transfer.    Veryl Speak, MD 05/30/14 [phone]

## 2014-05-30 NOTE — ED Notes (Signed)
Spoke with care link rep Claudell Kyle, stated that she would send a truck out

## 2014-05-30 NOTE — Progress Notes (Signed)
Pt declines blood transfusion at this time. Pt educated on risk of not having blood transfusion.  Pt states that if she does not receive the blood, that will be her way out of the world and all of her problems will be gone. Pt states she does not have the courage to commit sucide. Pt denies suicidal ideations at this time and contracts for safety. Pt states that she has not followed up with her therapist due to schedule conflicts of hers and the therapist. Pt tearful at this time and states that she does not have any family support and wishes to see her children. Pt states that she has not slept in the last 3 days due to her vaginal bleeding. Dr. Kennon Rounds notified at this time of patients decision of blood transfusion and requested medication for sleep. Will continue to monitor.

## 2014-05-30 NOTE — H&P (Signed)
Sara Yates is an 43 y.o. [id number]  female.    Chief Complaint: heavy vaginal bleeding   HPI: Has known fibroids.  Had Mirena and was using NuvaRing for cycle control.  Stopped in April.  Since then, has a lot of heavy regular menstrual cycles. Came to [place] today with bleeding.  Found to have Hgb of 6.1.  Reports DOE and SOB with headache.  She has a long complicated psych history and is not sure she would like a blood transfusion due to the spirit in the blood that might enter her body.   Past Medical History  Diagnosis Date  . Bipolar 1 disorder   . PTSD (post-traumatic stress disorder)   . ADHD (attention deficit hyperactivity disorder)   . Schizophrenia   . OCD (obsessive compulsive disorder)   . Panic   . Anemia   . Arthritis     degernative     Past Surgical History  Procedure Laterality Date  . Ganglion cyst excision    . Dilation and curettage of uterus      Family History  Problem Relation Age of Onset  . Cancer Mother 93    Colon  . Cancer Father     throat   Social History:  reports that she has been smoking Cigars and E-cigarettes.  She has been smoking about 0.00 packs per day. She has never used smokeless tobacco. She reports that she drinks about 16.8 ounces of alcohol per week. She reports that she uses illicit drugs (Marijuana).  Allergies:  Allergies  Allergen Reactions  . Latex Rash    No current facility-administered medications on file prior to encounter.   Current Outpatient Prescriptions on File Prior to Encounter  Medication Sig Dispense Refill  . asenapine (SAPHRIS) 5 MG SUBL 24 hr tablet Place 5 mg under the tongue 2 (two) times daily.      . ferrous sulfate 325 (65 FE) MG tablet Take 325 mg by mouth daily with breakfast.      . FLUoxetine (PROZAC) 40 MG capsule Take 30 mg by mouth daily.      Marland Kitchen lamoTRIgine (LAMICTAL) 100 MG tablet Take 100 mg by mouth daily.      Marland Kitchen lisdexamfetamine (VYVANSE) 50 MG capsule Take 50 mg by  mouth every morning.      . methylphenidate (RITALIN) 10 MG tablet Take 10 mg by mouth 2 (two) times daily.      . QUEtiapine (SEROQUEL) 100 MG tablet Take 100 mg by mouth at bedtime.        Pertinent items are noted in HPI.  Blood pressure 160/82, temperature 98.7 F (37.1 C), temperature source Oral, resp. rate 20, last menstrual period 04/30/2014, SpO2 100.00%. BP 160/82  Temp(Src) 98.7 F (37.1 C) (Oral)  Resp 20  SpO2 100%  LMP 04/30/2014 General appearance: alert, cooperative and appears stated age Head: Normocephalic, without obvious abnormality, atraumatic Neck: supple, symmetrical, trachea midline Lungs: normal effort Heart: regular rate and rhythm Abdomen: soft, non-tender; bowel sounds normal; no masses,  no organomegaly Extremities: extremities normal, atraumatic, no cyanosis or edema Pulses: 2+ and symmetric Skin: Skin color, texture, turgor normal. No rashes or lesions Neurologic: Grossly normal   Lab Results  Component Value Date   WBC 8.5 05/30/2014   HGB 6.1* 05/30/2014   HCT 20.3* 05/30/2014   MCV 74.6* 05/30/2014   PLT 63* 05/30/2014   Lab Results  Component Value Date   PREGTESTUR NEGATIVE 04/29/2014     Assessment/Plan Patient  Active Problem List   Diagnosis Date Noted  . Acute blood loss anemia 05/30/2014  . Bipolar 1 disorder, mixed, moderate 05/30/2014  . Schizophrenia 05/30/2014  . ADHD (attention deficit hyperactivity disorder) 05/30/2014  . Essential hypertension, benign 05/30/2014  . Abnormal uterine bleeding 08/04/2013  . Leiomyoma of uterus, unspecified 07/21/2013   For transfusion of 3 u PRBC's.  Premedicate with Tylenol and Benadryl.  Risks discussed at length.  She is mainly concerned with getting someone's spirit as part of the blood transfusion.  Megace to stop bleeding.  Length discussion about submucosal fibroids and the successful treatment of those.  Discussed that she is not a candidate for estrogen due to age and smoking status. She  continues to remain hopeful about another baby, although she realizes that may be risky. Has appointment with [facility] for PCP referral and possible treatment of her blood pressure. Begin HCTZ today.  PRATT,TANYA S 05/30/2014, 6:07 PM

## 2014-05-30 NOTE — ED Notes (Signed)
EDP aware of Hgb 6.1

## 2014-05-30 NOTE — ED Notes (Signed)
Pt presents to ED with complaints of heavy vaginal bleeding  For 2 days. Pt reports heavy discharge and back pain for a week before the bleeding started. PT reports RLQ pain also.

## 2014-05-30 NOTE — ED Notes (Signed)
EDP advised to cancel US-RT notified

## 2014-05-30 NOTE — ED Notes (Signed)
MD at bedside. 

## 2014-05-31 LAB — BASIC METABOLIC PANEL
Anion gap: 8 (ref 5–15)
BUN: 11 mg/dL (ref 6–23)
CHLORIDE: 105 meq/L (ref 96–112)
CO2: 24 mEq/L (ref 19–32)
CREATININE: 0.69 mg/dL (ref 0.50–1.10)
Calcium: 8.7 mg/dL (ref 8.4–10.5)
GFR calc Af Amer: >90 mL/min (ref >90)
GFR calc non Af Amer: >90 mL/min (ref >90)
GLUCOSE: 110 mg/dL — AB (ref 70–99)
POTASSIUM: 3.8 meq/L (ref 3.7–5.3)
Sodium: 137 mEq/L (ref 137–147)

## 2014-05-31 LAB — CBC
HCT: 19.4 % — ABNORMAL LOW (ref 36.0–46.0)
HEMATOCRIT: 18.6 % — AB (ref 36.0–46.0)
HEMOGLOBIN: 5.8 g/dL — AB (ref 12.0–15.0)
Hemoglobin: 5.5 g/dL — CL (ref 12.0–15.0)
MCH: 21.8 pg — ABNORMAL LOW (ref 26.0–34.0)
MCH: 21.9 pg — AB (ref 26.0–34.0)
MCHC: 29.6 g/dL — ABNORMAL LOW (ref 30.0–36.0)
MCHC: 29.9 g/dL — ABNORMAL LOW (ref 30.0–36.0)
MCV: 73.8 fL — ABNORMAL LOW (ref 78.0–100.0)
MCV: 73.2 fL — ABNORMAL LOW (ref 78.0–100.0)
Platelets: 69 10*3/uL — ABNORMAL LOW (ref 150–400)
Platelets: 92 10*3/uL — ABNORMAL LOW (ref 150–400)
RBC: 2.52 MIL/uL — AB (ref 3.87–5.11)
RBC: 2.65 MIL/uL — AB (ref 3.87–5.11)
RDW: 24.8 % — AB (ref 11.5–15.5)
RDW: 25.1 % — ABNORMAL HIGH (ref 11.5–15.5)
WBC: 9.7 10*3/uL (ref 4.0–10.5)
WBC: 10.1 10*3/uL (ref 4.0–10.5)

## 2014-05-31 LAB — GC/CHLAMYDIA PROBE AMP
CT Probe RNA: NEGATIVE
GC Probe RNA: NEGATIVE

## 2014-05-31 LAB — TSH: TSH: 0.767 u[IU]/mL (ref 0.350–4.500)

## 2014-05-31 MED ORDER — POLYETHYLENE GLYCOL 3350 17 G PO PACK
17.0000 g | PACK | Freq: Every day | ORAL | Status: DC | PRN
  Filled 2014-05-31: qty 1

## 2014-05-31 MED ORDER — FERUMOXYTOL INJECTION 510 MG/17 ML
510.0000 mg | Freq: Once | INTRAVENOUS | Status: AC
  Administered 2014-05-31: 510 mg via INTRAVENOUS
  Filled 2014-05-31: qty 17

## 2014-05-31 MED ORDER — SENNA 8.6 MG PO TABS
2.0000 | ORAL_TABLET | Freq: Every day | ORAL | Status: DC | PRN
  Administered 2014-06-01: 17.2 mg via ORAL
  Filled 2014-05-31: qty 2

## 2014-05-31 NOTE — Progress Notes (Signed)
05/31/14 1700  Clinical Encounter Type  Visited With Patient  Visit Type Follow-up;Spiritual support;Social support  Referral From Chaplain;Nurse  Spiritual Encounters  Spiritual Needs Emotional  Stress Factors  Patient Stress Factors Health changes;Exhausted;Family relationships;Financial concerns;Loss of control;Major life changes   Followed up with Krystian to offer further spiritual and emotional support.  Per pt, she is still struggling with whether she wants to receive blood transfusion and whether she wants to work as hard as her struggles demand.  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 A central struggle is maintaining the physical and emotional energy needed to heal, to grow, and to pursue her dreams (of education, teaching, etc).  Provided pastoral presence, reflective listening, and spiritual companionship as she worked through her mixed feelings and shared stories of her emotional struggles and painful personal history.  [place] will follow, but please also page 24/7 as needed.  Thank you.  [place], [place]

## 2014-05-31 NOTE — Progress Notes (Signed)
Patient ID: Sara Yates, female   DOB: 1971-09-12, 43 y.o.   MRN: [id number] S: Reports dizziness, generalized HA, Difficulties w/ balance w/ walking, all new over the past three days. Vaginal bleeding has stopped. Denies palpitations, vision changes, difficulties w/ speech or gait.  O:  Patient Vitals for the past 24 hrs:  BP Temp Temp src Pulse Resp SpO2 Height Weight  05/31/14 0549 104/52 mmHg 98.3 F (36.8 C) Oral 61 16 100 % - 78.475 kg (173 lb 0.1 oz)  05/31/14 0204 104/58 mmHg 98.7 F (37.1 C) Oral 65 16 100 % - -  05/30/14 2154 134/82 mmHg 98 F (36.7 C) Oral 72 16 100 % - -  05/30/14 1951 137/81 mmHg - - - - - - -  05/30/14 1716 160/82 mmHg 98.7 F (37.1 C) Oral - 20 100 % 6' (1.829 m) 79.635 kg (175 lb 9 oz)   GENERAL: NAD. Pale. Normal speech. A&Ox4 HEART: RRR LUNGS: Normal effort. CTAB ABD: Soft NT. PELVIC: Scant blood on pad.   Results for orders placed during the hospital encounter of 05/30/14 (from the past 24 hour(s))  PREPARE RBC (CROSSMATCH)     Status: None   Collection Time    05/30/14  6:30 PM      Result Value Ref Range   Order Confirmation ORDER PROCESSED BY BLOOD BANK    TYPE AND SCREEN     Status: None   Collection Time    05/30/14  7:05 PM      Result Value Ref Range   ABO/RH(D) O POS     Antibody Screen NEG     Sample Expiration 06/02/2014     Unit Number [id number]     Blood Component Type RED CELLS,LR     Unit division 00     Status of Unit ALLOCATED     Transfusion Status OK TO TRANSFUSE     Crossmatch Result Compatible     Unit Number [id number]     Blood Component Type RED CELLS,LR     Unit division 00     Status of Unit ALLOCATED     Transfusion Status OK TO TRANSFUSE     Crossmatch Result Compatible     Unit Number [id number]     Blood Component Type RED CELLS,LR     Unit division 00     Status of Unit ALLOCATED     Transfusion Status OK TO TRANSFUSE     Crossmatch Result Compatible    ABO/RH     Status: None   Collection Time    05/30/14  7:05 PM      Result Value Ref Range   ABO/RH(D) O POS    BASIC METABOLIC PANEL     Status: Abnormal   Collection Time    05/31/14  5:42 AM      Result Value Ref Range   Sodium 137  137 - 147 mEq/L   Potassium 3.8  3.7 - 5.3 mEq/L   Chloride 105  96 - 112 mEq/L   CO2 24  19 - 32 mEq/L   Glucose, Bld 110 (*) 70 - 99 mg/dL   BUN 11  6 - 23 mg/dL   Creatinine, Ser 0.69  0.50 - 1.10 mg/dL   Calcium 8.7  8.4 - 10.5 mg/dL   GFR calc non Af Amer >90  >90 mL/min   GFR calc Af Amer >90  >90 mL/min   Anion gap 8  5 - 15  TSH  Status: None   Collection Time    05/31/14  5:42 AM      Result Value Ref Range   TSH 0.767  0.350 - 4.500 uIU/mL  CBC     Status: Abnormal (Preliminary result)   Collection Time    05/31/14  9:16 AM      Result Value Ref Range   WBC 9.7  4.0 - 10.5 K/uL   RBC 2.52 (*) 3.87 - 5.11 MIL/uL   Hemoglobin 5.5 (*) 12.0 - 15.0 g/dL   HCT 18.6 (*) 36.0 - 46.0 %   MCV 73.8 (*) 78.0 - 100.0 fL   MCH 21.8 (*) 26.0 - 34.0 pg   MCHC 29.6 (*) 30.0 - 36.0 g/dL   RDW 24.8 (*) 11.5 - 15.5 %   Platelets PENDING  150 - 400 K/uL   A:  Menorrhagia: stable on Megace Anemia of acute blood loss Thrombocytopenia-unsure if purely from blood loss.  P: Consulted w/ Dr. Nehemiah Settle RE: pt refusal of PRBCs, thrombocytopenia, . Lengthy discussion w/ Pt RE: very low Hgb and Plts. Still refuses PRBCs. Offered Feraheme infusion as an alternative, but discussed that Plts are still pending and may need to addressed separately if extremely low.  Reviewed meds, Hx. Only possible cause of thrombocytopenia is Lamictal, but pt states she has been on it for years and review of CBCs over the past two years show normal Ptls. Also inquired about Hx thrombocytopenia--pt denies.    Will reassess pt after Feraheme infusion and after Ptls count back to discuss POC.   [place], CNM 05/31/2014 10:20 AM

## 2014-05-31 NOTE — Care Management Note (Signed)
    Page 1 of 1   05/31/2014     2:53:25 PM CARE MANAGEMENT NOTE 05/31/2014  Patient:  Sara Yates,Sara Yates   Account Number:  [id number]  Date Initiated:  05/31/2014  Documentation initiated by:  CRAFT,TERRI  Subjective/Objective Assessment:   43 year old female admitted 05/30/14 with heavy bleeding.     Action/Plan:   D/C when medically stable   Anticipated DC Date:  06/03/2014   Anticipated DC Plan:  HOME/SELF CARE  In-house referral  Clinical Social Worker      DC Planning Services  CM consult  Medication Assistance                Status of service:  Completed, signed off  Per UR Regulation:  Reviewed for med. necessity/level of care/duration of stay   Comments:  05/31/14, Aida Raider RNC-MNN, BSN, [phone], CM received referral for medication assistance.  Pt has Medciaid and is not eligible for medication assistance.  CM spoke with pt's RN concerning referral.

## 2014-05-31 NOTE — Progress Notes (Signed)
UR completed 
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
## 2014-06-01 LAB — CBC
HCT: 21 % — ABNORMAL LOW (ref 36.0–46.0)
Hemoglobin: 6.4 g/dL — CL (ref 12.0–15.0)
MCH: 22.4 pg — AB (ref 26.0–34.0)
MCHC: 30.5 g/dL (ref 30.0–36.0)
MCV: 73.4 fL — AB (ref 78.0–100.0)
Platelets: 142 10*3/uL — ABNORMAL LOW (ref 150–400)
RBC: 2.86 MIL/uL — AB (ref 3.87–5.11)
RDW: 25.7 % — ABNORMAL HIGH (ref 11.5–15.5)
WBC: 12.4 10*3/uL — AB (ref 4.0–10.5)

## 2014-06-01 MED ORDER — ACETAMINOPHEN 500 MG PO TABS
1000.0000 mg | ORAL_TABLET | Freq: Once | ORAL | Status: AC
  Administered 2014-06-01: 1000 mg via ORAL
  Filled 2014-06-01 (×2): qty 2

## 2014-06-01 MED ORDER — SODIUM CHLORIDE 0.9 % IV SOLN
510.0000 mg | INTRAVENOUS | Status: DC
  Filled 2014-06-01: qty 17

## 2014-06-01 MED ORDER — DIPHENHYDRAMINE HCL 25 MG PO CAPS
25.0000 mg | ORAL_CAPSULE | Freq: Once | ORAL | Status: AC
  Administered 2014-06-01: 25 mg via ORAL
  Filled 2014-06-01: qty 1

## 2014-06-01 MED ORDER — PNEUMOCOCCAL VAC POLYVALENT 25 MCG/0.5ML IJ INJ
0.5000 mL | INJECTION | INTRAMUSCULAR | Status: AC
  Administered 2014-06-02: 0.5 mL via INTRAMUSCULAR
  Filled 2014-06-01: qty 0.5

## 2014-06-01 MED ORDER — SODIUM CHLORIDE 0.9 % IV SOLN
Freq: Once | INTRAVENOUS | Status: AC
Start: 2014-06-01 — End: 2014-06-01
  Administered 2014-06-01: 11:00:00 via INTRAVENOUS

## 2014-06-01 NOTE — Progress Notes (Signed)
Subjective: Patient reports worsening fatigues, dizziness and headaches. Now wants blood transfusion; talked to a pastor friend who convinced her that "spirits do not come into the body when you get blood".    Objective: I have reviewed patient's vital signs, medications and labs.  Has received one dose of Feraheme. Temp:  [98.4 F (36.9 C)-99.2 F (37.3 C)] 98.4 F (36.9 C) (09/09 0518) Pulse Rate:  [57-72] 57 (09/09 0518) Resp:  [18] 18 (09/09 0518) BP: (112-120)/(60-75) 112/60 mmHg (09/09 0518) SpO2:  [100 %] 100 % (09/09 0518) Weight:  [171 lb (77.565 kg)] 171 lb (77.565 kg) (09/09 0518) General: alert and no distress GI: soft, non-tender; bowel sounds normal; no masses,  no organomegaly Extremities: extremities normal, atraumatic, no cyanosis or edema and Homans sign is negative, no sign of DVT Vaginal Bleeding: minimal  CBC Latest Ref Rng 06/01/2014 05/31/2014 05/31/2014  WBC 4.0 - 10.5 K/uL 12.4(H) 10.1 9.7  Hemoglobin 12.0 - 15.0 g/dL 6.4(LL) 5.8(LL) 5.5(LL)  Hematocrit 36.0 - 46.0 % 21.0(L) 19.4(L) 18.6(L)  Platelets 150 - 400 K/uL 142(L) 92(L) 69(L)    Assessment/Plan: Symptomatic anemia, s/p Ferraheme, now desiring transfusion Counseled about transfusion in detail, all questions answered Ordered 3 units of pRBCs with premedication Will recheck CBC after transfusion Continue routine care, likely discharge in morning.   LOS: 2 days    Maimouna Rondeau A, MD 06/01/2014, 10:34 AM

## 2014-06-01 NOTE — Progress Notes (Signed)
Sara Yates reported that she is beginning to feel a little more hopeful.  The blood transfusion was to accept, but as she was receiving the blood she reported that she wanted to get healthier and stronger.  She had spoken with a cousin who is a pastor who helped her to make the decision to go ahead and receive the transfusion.    She talked about her desires to be a better mother to her children and to look for a job with benefits so that she could help provide for them better.  She shared some of her frustrations with her children's father, but she stayed focused on her goal of trying to be able to help them as much as she can.  She has worked hard at school and at her job, and she reports that she wants to continue with her schooling so that she can live a more stable life with a more stable job.    She spoke about the ups and downs that come with a bipolar diagnosis and she attributed a spiritual up and down that coincided with her emotional and mental ups and downs.  When she is feeling more positive, she can feel God's presence more strongly.  When she is feeling low, she feels that God is not helping her.  As we were speaking she began to feel drowsy and we stopped talking for the time.  We will attempt follow up support tomorrow.  [employer] Pager, [phone] 3:19 PM   06/01/14 1500  Clinical Encounter Type  Visited With Patient  Visit Type Spiritual support  Referral From [place] Needs Emotional

## 2014-06-01 NOTE — Progress Notes (Signed)
UR chart review completed.  

## 2014-06-01 NOTE — Discharge Instructions (Signed)
Uterine Fibroid A uterine fibroid is a growth (tumor) that occurs in your uterus. This type of tumor is not cancerous and does not spread out of the uterus. You can have one or many fibroids. Fibroids can vary in size, weight, and where they grow in the uterus. Some can become quite large. Most fibroids do not require medical treatment, but some can cause pain or heavy bleeding during and between periods. CAUSES  A fibroid is the result of a single uterine cell that keeps growing (unregulated), which is different than most cells in the human body. Most cells have a control mechanism that keeps them from reproducing without control.  SIGNS AND SYMPTOMS   Bleeding.  Pelvic pain and pressure.  Bladder problems due to the size of the fibroid.  Infertility and miscarriages depending on the size and location of the fibroid. DIAGNOSIS  Uterine fibroids are diagnosed through a physical exam. Your health care provider may feel the lumpy tumors during a pelvic exam. Ultrasonography may be done to get information regarding size, location, and number of tumors.  TREATMENT   Your health care provider may recommend watchful waiting. This involves getting the fibroid checked by your health care provider to see if it grows or shrinks.   Hormone treatment or an intrauterine device (IUD) may be prescribed.   Surgery may be needed to remove the fibroids (myomectomy) or the uterus (hysterectomy). This depends on your situation. When fibroids interfere with fertility and a woman wants to become pregnant, a health care provider may recommend having the fibroids removed.  [facility] care depends on how you were treated. In general:   Keep all follow-up appointments with your health care provider.   Only take over-the-counter or prescription medicines as directed by your health care provider. If you were prescribed a hormone treatment, take the hormone medicines exactly as directed. Do not  take aspirin. It can cause bleeding.   Talk to your health care provider about taking iron pills.  If your periods are troublesome but not so heavy, lie down with your feet raised slightly above your heart. Place cold packs on your lower abdomen.   If your periods are heavy, write down the number of pads or tampons you use per month. Bring this information to your health care provider.   Include green vegetables in your diet.  SEEK IMMEDIATE MEDICAL CARE IF:  You have pelvic pain or cramps not controlled with medicines.   You have a sudden increase in pelvic pain.   You have an increase in bleeding between and during periods.   You have excessive periods and soak tampons or pads in a half hour or less.  You feel lightheaded or have fainting episodes. Document Released: 09/06/2000 Document Revised: 06/30/2013 Document Reviewed: 04/08/2013 [facility] Patient Information 2015 [place], [place]. This information is not intended to replace advice given to you by your health care provider. Make sure you discuss any questions you have with your health care provider.   Anemia, Nonspecific Anemia is a condition in which the concentration of red blood cells or hemoglobin in the blood is below normal. Hemoglobin is a substance in red blood cells that carries oxygen to the tissues of the body. Anemia results in not enough oxygen reaching these tissues.  CAUSES  Common causes of anemia include:   Excessive bleeding. Bleeding may be internal or external. This includes excessive bleeding from periods (in women) or from the intestine.   Poor nutrition.  Chronic kidney, thyroid, and liver disease.  Bone marrow disorders that decrease red blood cell production.  Cancer and treatments for cancer.  HIV, AIDS, and their treatments.  Spleen problems that increase red blood cell destruction.  Blood disorders.  Excess destruction of red blood cells due to infection, medicines, and  autoimmune disorders. SIGNS AND SYMPTOMS   Minor weakness.   Dizziness.   Headache.  Palpitations.   Shortness of breath, especially with exercise.   Paleness.  Cold sensitivity.  Indigestion.  Nausea.  Difficulty sleeping.  Difficulty concentrating. Symptoms may occur suddenly or they may develop slowly.  DIAGNOSIS  Additional blood tests are often needed. These help your health care provider determine the best treatment. Your health care provider will check your stool for blood and look for other causes of blood loss.  TREATMENT  Treatment varies depending on the cause of the anemia. Treatment can include:   Supplements of iron, vitamin M25, or folic acid.   Hormone medicines.   A blood transfusion. This may be needed if blood loss is severe.   Hospitalization. This may be needed if there is significant continual blood loss.   Dietary changes.  Spleen removal. HOME CARE INSTRUCTIONS Keep all follow-up appointments. It often takes many weeks to correct anemia, and having your health care provider check on your condition and your response to treatment is very important. SEEK IMMEDIATE MEDICAL CARE IF:   You develop extreme weakness, shortness of breath, or chest pain.   You become dizzy or have trouble concentrating.  You develop heavy vaginal bleeding.   You develop a rash.   You have bloody or black, tarry stools.   You faint.   You vomit up blood.   You vomit repeatedly.   You have abdominal pain.  You have a fever or persistent symptoms for more than 2-3 days.   You have a fever and your symptoms suddenly get worse.   You are dehydrated.  MAKE SURE YOU:  Understand these instructions.  Will watch your condition.  Will get help right away if you are not doing well or get worse. Document Released: 10/17/2004 Document Revised: 05/12/2013 Document Reviewed: 03/05/2013 [facility] Patient Information 2015 [place], [place]. This  information is not intended to replace advice given to you by your health care provider. Make sure you discuss any questions you have with your health care provider.

## 2014-06-01 NOTE — Progress Notes (Signed)
IV dislodged right before 2nd unit of blood finished. About 20 cc's of blood left in the bag. Bag disposed of and new IV started. Third unit of blood started.

## 2014-06-02 DIAGNOSIS — D259 Leiomyoma of uterus, unspecified: Secondary | ICD-10-CM

## 2014-06-02 DIAGNOSIS — N920 Excessive and frequent menstruation with regular cycle: Secondary | ICD-10-CM

## 2014-06-02 DIAGNOSIS — D649 Anemia, unspecified: Secondary | ICD-10-CM

## 2014-06-02 LAB — TYPE AND SCREEN
ABO/RH(D): O POS
ANTIBODY SCREEN: NEGATIVE
UNIT DIVISION: 0
UNIT DIVISION: 0
UNIT DIVISION: 0
Unit division: 0
Unit division: 0

## 2014-06-02 LAB — CBC
HEMATOCRIT: 29.7 % — AB (ref 36.0–46.0)
Hemoglobin: 9.7 g/dL — ABNORMAL LOW (ref 12.0–15.0)
MCH: 24.6 pg — ABNORMAL LOW (ref 26.0–34.0)
MCHC: 32.7 g/dL (ref 30.0–36.0)
MCV: 75.4 fL — ABNORMAL LOW (ref 78.0–100.0)
Platelets: 195 10*3/uL (ref 150–400)
RBC: 3.94 MIL/uL (ref 3.87–5.11)
RDW: 23.5 % — AB (ref 11.5–15.5)
WBC: 15.6 10*3/uL — AB (ref 4.0–10.5)

## 2014-06-02 MED ORDER — MEGESTROL ACETATE 40 MG PO TABS: 40.0000 mg | ORAL_TABLET | Freq: Two times a day (BID) | ORAL | Status: DC

## 2014-06-02 MED ORDER — ZOLPIDEM TARTRATE 5 MG PO TABS: 5.0000 mg | ORAL_TABLET | Freq: Every evening | ORAL | Status: DC | PRN

## 2014-06-02 MED ORDER — LAMOTRIGINE 100 MG PO TABS: 100.0000 mg | ORAL_TABLET | Freq: Every day | ORAL | Status: DC

## 2014-06-02 MED ORDER — ASENAPINE MALEATE 5 MG SL SUBL: 5.0000 mg | SUBLINGUAL_TABLET | Freq: Two times a day (BID) | SUBLINGUAL | Status: DC | PRN

## 2014-06-02 MED ORDER — FLUOXETINE HCL 40 MG PO CAPS: 40.0000 mg | ORAL_CAPSULE | Freq: Every day | ORAL | Status: DC

## 2014-06-02 NOTE — Progress Notes (Signed)
06/02/14 1200  Clinical Encounter Type  Visited With Patient  Visit Type Follow-up;Spiritual support;Social support  Referral From Chaplain  Spiritual Encounters  Spiritual Needs Emotional  Stress Factors  Patient Stress Factors Exhausted;Financial concerns   Made lengthy follow-up visit with Erendida, who shared about painful personal and family hx, current communication/parenting issues with ex-husband, motivation of education, and other themes important to her.  Per pt, she is particularly stressed about financial/job concerns and energy level.  Provided pastoral presence and empathic listening, concluding visit with strengths-based coaching and affirmation.  [place], [place]

## 2014-06-02 NOTE — Discharge Summary (Signed)
Physician Discharge Summary  Patient ID: Sara Yates MRN: [id number] DOB/AGE: 23-Feb-1971 43 y.o.  Admit date: 05/30/2014 Discharge date: 06/02/2014  Admission Diagnoses:anemia, dysfunctional uterine bleeding and fibroid uterus  Discharge Diagnoses: same Principal Problem:   Acute blood loss anemia Active Problems:   Leiomyoma of uterus, unspecified   Abnormal uterine bleeding   Essential hypertension, benign   Discharged Condition: fair  Hospital Course: Sara Yates is an 43 y.o. [id number] female.  Chief Complaint: heavy vaginal bleeding  HPI: Has known fibroids. Had Mirena and was using NuvaRing for cycle control. Stopped in April. Since then, has a lot of heavy regular menstrual cycles. Came to [place] today with bleeding. Found to have Hgb of 6.1. Reports DOE and SOB with headache. She has a long complicated psych history and is not sure she would like a blood transfusion due to the spirit in the blood that might enter her body.  Past Medical History   Diagnosis  Date   .  Bipolar 1 disorder    .  PTSD (post-traumatic stress disorder)    .  ADHD (attention deficit hyperactivity disorder)    .  Schizophrenia    .  OCD (obsessive compulsive disorder)    .  Panic    .  Anemia    .  Arthritis      degernative    Past Surgical History   Procedure  Laterality  Date   .  Ganglion cyst excision     .  Dilation and curettage of uterus      Family History   Problem  Relation  Age of Onset   .  Cancer  Mother  19     Colon   .  Cancer  Father      throat   Social History: reports that she has been smoking Cigars and E-cigarettes. She has been smoking about 0.00 packs per day. She has never used smokeless tobacco. She reports that she drinks about 16.8 ounces of alcohol per week. She reports that she uses illicit drugs (Marijuana).  Allergies:  Allergies   Allergen  Reactions   .  Latex  Rash    No current facility-administered medications on file prior to  encounter.    Current Outpatient Prescriptions on File Prior to Encounter   Medication  Sig  Dispense  Refill   .  asenapine (SAPHRIS) 5 MG SUBL 24 hr tablet  Place 5 mg under the tongue 2 (two) times daily.     .  ferrous sulfate 325 (65 FE) MG tablet  Take 325 mg by mouth daily with breakfast.     .  FLUoxetine (PROZAC) 40 MG capsule  Take 30 mg by mouth daily.     Marland Kitchen  lamoTRIgine (LAMICTAL) 100 MG tablet  Take 100 mg by mouth daily.     Marland Kitchen  lisdexamfetamine (VYVANSE) 50 MG capsule  Take 50 mg by mouth every morning.     .  methylphenidate (RITALIN) 10 MG tablet  Take 10 mg by mouth 2 (two) times daily.     .  QUEtiapine (SEROQUEL) 100 MG tablet  Take 100 mg by mouth at bedtime.     Pertinent items are noted in HPI.  Blood pressure 160/82, temperature 98.7 F (37.1 C), temperature source Oral, resp. rate 20, last menstrual period 04/30/2014, SpO2 100.00%.  BP 160/82  Temp(Src) 98.7 F (37.1 C) (Oral)  Resp 20  SpO2 100%  LMP 04/30/2014  General appearance: alert, cooperative and  appears stated age  Head: Normocephalic, without obvious abnormality, atraumatic  Neck: supple, symmetrical, trachea midline  Lungs: normal effort  Heart: regular rate and rhythm  Abdomen: soft, non-tender; bowel sounds normal; no masses, no organomegaly  Extremities: extremities normal, atraumatic, no cyanosis or edema  Pulses: 2+ and symmetric  Skin: Skin color, texture, turgor normal. No rashes or lesions  Neurologic: Grossly normal  Lab Results   Component  Value  Date    WBC  8.5  05/30/2014    HGB  6.1*  05/30/2014    HCT  20.3*  05/30/2014    MCV  74.6*  05/30/2014    PLT  63*  05/30/2014    Lab Results   Component  Value  Date    PREGTESTUR  NEGATIVE  04/29/2014   Assessment/Plan  Patient Active Problem List    Diagnosis  Date Noted   .  Acute blood loss anemia  05/30/2014   .  Bipolar 1 disorder, mixed, moderate  05/30/2014   .  Schizophrenia  05/30/2014   .  ADHD (attention deficit hyperactivity  disorder)  05/30/2014   .  Essential hypertension, benign  05/30/2014   .  Abnormal uterine bleeding  08/04/2013   .  Leiomyoma of uterus, unspecified  07/21/2013   For transfusion of 3 u PRBC's. Premedicate with Tylenol and Benadryl. Risks discussed at length. She is mainly concerned with getting someone's spirit as part of the blood transfusion.  Megace to stop bleeding. Length discussion about submucosal fibroids and the successful treatment of those. Discussed that she is not a candidate for estrogen due to age and smoking status. She continues to remain hopeful about another baby, although she realizes that may be risky.  Has appointment with Ruby Cola for PCP referral and possible treatment of her blood pressure.  Begin HCTZ today.  PRATT,TANYA S  05/30/2014, 6:07 PM Patient received 3 units PRBC and Hb improved to 9.7.   Consults: None  Significant Diagnostic Studies: labs:  CBC    Component Value Date/Time   WBC 15.6* 06/01/2014 2358   RBC 3.94 06/01/2014 2358   HGB 9.7* 06/01/2014 2358   HCT 29.7* 06/01/2014 2358   PLT 195 06/01/2014 2358   MCV 75.4* 06/01/2014 2358   MCH 24.6* 06/01/2014 2358   MCHC 32.7 06/01/2014 2358   RDW 23.5* 06/01/2014 2358   LYMPHSABS 1.9 05/30/2014 1430   MONOABS 0.6 05/30/2014 1430   EOSABS 0.2 05/30/2014 1430   BASOSABS 0.2* 05/30/2014 1430      Treatments: transfusion   Discharge Exam: Blood pressure 118/75, pulse 62, temperature 99.3 F (37.4 C), temperature source Oral, resp. rate 18, height 6' (1.829 m), weight 171 lb (77.565 kg), last menstrual period 04/30/2014, SpO2 100.00%. General appearance: alert, cooperative and no distress  Disposition: 01-Home or Self Care     Medication List    ASK your doctor about these medications       asenapine 5 MG Subl 24 hr tablet  Commonly known as:  SAPHRIS  Place 5 mg under the tongue 2 (two) times daily as needed (Pt states that she takes this medication when she has racing thoughts.).      etonogestrel-ethinyl estradiol 0.12-0.015 MG/24HR vaginal ring  Commonly known as:  NUVARING  Insert vaginally and leave in place for 3 consecutive weeks, then remove for 1 week.     ferrous sulfate 325 (65 FE) MG tablet  Take 325 mg by mouth daily as needed (for headaches.).  fluconazole 150 MG tablet  Commonly known as:  DIFLUCAN  Take 1 tablet (150 mg total) by mouth once.     FLUoxetine 40 MG capsule  Commonly known as:  PROZAC  Take 40 mg by mouth daily.     ibuprofen 200 MG tablet  Commonly known as:  ADVIL,MOTRIN  Take 800 mg by mouth 2 (two) times daily as needed for moderate pain.     lamoTRIgine 100 MG tablet  Commonly known as:  LAMICTAL  Take 100 mg by mouth daily.     lisdexamfetamine 30 MG capsule  Commonly known as:  VYVANSE  Take 30-60 mg by mouth daily as needed (Pt states she only uses this medication when she needs to focus on a task.).     metroNIDAZOLE 1 % gel  Commonly known as:  METROGEL  Apply topically daily.     metroNIDAZOLE 500 MG tablet  Commonly known as:  FLAGYL  Take 1 tablet (500 mg total) by mouth 2 (two) times daily. One po bid x 7 days     multivitamin with minerals Tabs tablet  Take 1 tablet by mouth daily.     PROBIOTIC ACIDOPHILUS PO  Take 1 capsule by mouth daily as needed (for stomach issues.).           Follow-up Information   Follow up with CONSTANT,PEGGY, MD On 06/20/2014. (2:15 pm appointment at the clinic at [facility]. Call clinic/come to [facility] for any concerning issues.)    Specialty:  Obstetrics and Gynecology   Contact information:   [place] [address] [phone]       Signed: Emeterio Reeve 06/02/2014, 12:13 PM

## 2014-06-02 NOTE — Progress Notes (Signed)
Pt is discharged in the care of friend, with R.N. Escort. Discharge instructions with Rx were given to p[t/ Questions asked and answered. No equipment needed for home use. Denies vaginal bleeding or pain   Stable.

## 2014-06-20 ENCOUNTER — Encounter: Payer: Self-pay | Admitting: Obstetrics and Gynecology

## 2014-06-20 ENCOUNTER — Ambulatory Visit (INDEPENDENT_AMBULATORY_CARE_PROVIDER_SITE_OTHER): Payer: Medicaid Other | Admitting: Obstetrics and Gynecology

## 2014-06-20 VITALS — BP 144/90 | HR 92 | Ht 72.0 in | Wt 179.9 lb

## 2014-06-20 DIAGNOSIS — N938 Other specified abnormal uterine and vaginal bleeding: Secondary | ICD-10-CM

## 2014-06-20 DIAGNOSIS — N925 Other specified irregular menstruation: Secondary | ICD-10-CM

## 2014-06-20 DIAGNOSIS — N949 Unspecified condition associated with female genital organs and menstrual cycle: Secondary | ICD-10-CM

## 2014-06-20 DIAGNOSIS — D259 Leiomyoma of uterus, unspecified: Secondary | ICD-10-CM

## 2014-06-20 MED ORDER — ZOLPIDEM TARTRATE 5 MG PO TABS: 5.0000 mg | ORAL_TABLET | Freq: Every evening | ORAL | Status: DC | PRN

## 2014-06-20 MED ORDER — FERROUS SULFATE 325 (65 FE) MG PO TABS: 325.0000 mg | ORAL_TABLET | Freq: Two times a day (BID) | ORAL | Status: DC

## 2014-06-20 NOTE — Progress Notes (Signed)
Referral made to interventional radiology ([facility] location). Called Tammy, scheduler, at [phone] who confirmed referral received through [facility] and stated she will contact patient directly with appointment date and time. Patient informed.   Ambien RX called into [facility] on [address].
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
## 2014-06-20 NOTE — Progress Notes (Signed)
Patient still has some spotting since being on Megace. She was using a Mirena but states that it was inserted wrong and had to be removed. Was seeing Dr. Delsa Sale for her bleeding earlier this year.

## 2014-07-14 ENCOUNTER — Ambulatory Visit
Admission: RE | Admit: 2014-07-14 | Discharge: 2014-07-14 | Disposition: A | Discharge status: Home / Self Care | Payer: Medicaid Other | Source: Ambulatory Visit | Attending: Obstetrics and Gynecology | Admitting: Obstetrics and Gynecology

## 2014-07-14 DIAGNOSIS — D259 Leiomyoma of uterus, unspecified: Secondary | ICD-10-CM

## 2014-07-14 DIAGNOSIS — N938 Other specified abnormal uterine and vaginal bleeding: Secondary | ICD-10-CM
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
## 2014-07-18 ENCOUNTER — Telehealth: Payer: Self-pay | Admitting: *Deleted

## 2014-07-18 NOTE — Telephone Encounter (Addendum)
Message copied by Langston Reusing on Mon Jul 18, 2014  3:43 PM ------      Message from: Mora Bellman      Created: Mon Jul 18, 2014  1:33 PM       Please inform patient that an MRI is requested by interventional radiology before proceeding with uterine artery embolization. If patient wishes to proceed, please schedule the MRI of pelvis (per IR).            Thanks            Peggy  ------ Per discussion with Dr. Elly Modena, need to find out why pt thinks she may be pregnant as mentioned in notes from Radiologist. Has she performed UPT? When was LMP? What are her symptoms? If she is not pregnant, pt needs to be informed that MRI w/contrast is necessary prior to proceeding with UFE if she is still interested in having the procedure. If pt has had home UPT which is negative but still has strong suspicion of pregnancy including pregnancy sx, she will need HCGQ drawn. Called pt and heard message stating that the person called is unavailable. Unable to leave message.  10/27  1600  Called pt and again heard message stating person called is unavailable.  10/28  1120  Called pt and again was unable to leave message for pt. I then called her alternate contact (daughter) and left message requesting her to have her mother call us regarding important but not emergent information.

## 2014-07-25 ENCOUNTER — Encounter: Payer: Self-pay | Admitting: Obstetrics and Gynecology

## 2014-07-27 ENCOUNTER — Encounter: Payer: Self-pay | Admitting: General Practice

## 2014-07-27 NOTE — Telephone Encounter (Signed)
Called patient and message stating this person is unavailable right now. Called patient's daughter, no answer- left message that we are trying to reach Sara Yates, please call us back at the clinics. Will send letter as we have been unable to reach patient.

## 2014-09-19 ENCOUNTER — Encounter: Payer: Self-pay | Admitting: *Deleted

## 2014-09-20 ENCOUNTER — Encounter: Payer: Self-pay | Admitting: Obstetrics & Gynecology

## 2014-09-25 ENCOUNTER — Emergency Department (HOSPITAL_BASED_OUTPATIENT_CLINIC_OR_DEPARTMENT_OTHER)
Admission: EM | Admit: 2014-09-25 | Discharge: 2014-09-25 | Disposition: A | Discharge status: Home / Self Care | Payer: Medicaid Other | Attending: Emergency Medicine | Admitting: Emergency Medicine

## 2014-09-25 ENCOUNTER — Encounter (HOSPITAL_BASED_OUTPATIENT_CLINIC_OR_DEPARTMENT_OTHER): Payer: Self-pay | Admitting: *Deleted

## 2014-09-25 DIAGNOSIS — Z3202 Encounter for pregnancy test, result negative: Secondary | ICD-10-CM | POA: Insufficient documentation

## 2014-09-25 DIAGNOSIS — F319 Bipolar disorder, unspecified: Secondary | ICD-10-CM | POA: Insufficient documentation

## 2014-09-25 DIAGNOSIS — Z720 Tobacco use: Secondary | ICD-10-CM | POA: Insufficient documentation

## 2014-09-25 DIAGNOSIS — Z91040 Latex allergy status: Secondary | ICD-10-CM | POA: Insufficient documentation

## 2014-09-25 DIAGNOSIS — M1990 Unspecified osteoarthritis, unspecified site: Secondary | ICD-10-CM | POA: Diagnosis not present

## 2014-09-25 DIAGNOSIS — F909 Attention-deficit hyperactivity disorder, unspecified type: Secondary | ICD-10-CM | POA: Insufficient documentation

## 2014-09-25 DIAGNOSIS — F4310 Post-traumatic stress disorder, unspecified: Secondary | ICD-10-CM | POA: Insufficient documentation

## 2014-09-25 DIAGNOSIS — Z79899 Other long term (current) drug therapy: Secondary | ICD-10-CM | POA: Diagnosis not present

## 2014-09-25 DIAGNOSIS — F209 Schizophrenia, unspecified: Secondary | ICD-10-CM | POA: Insufficient documentation

## 2014-09-25 DIAGNOSIS — N760 Acute vaginitis: Secondary | ICD-10-CM | POA: Diagnosis not present

## 2014-09-25 DIAGNOSIS — Z792 Long term (current) use of antibiotics: Secondary | ICD-10-CM | POA: Insufficient documentation

## 2014-09-25 DIAGNOSIS — N898 Other specified noninflammatory disorders of vagina: Secondary | ICD-10-CM

## 2014-09-25 DIAGNOSIS — B9689 Other specified bacterial agents as the cause of diseases classified elsewhere: Secondary | ICD-10-CM

## 2014-09-25 HISTORY — DX: Benign neoplasm of connective and other soft tissue, unspecified: D21.9

## 2014-09-25 HISTORY — DX: Encounter for other specified aftercare: Z51.89

## 2014-09-25 LAB — URINALYSIS, ROUTINE W REFLEX MICROSCOPIC
Bilirubin Urine: NEGATIVE
Glucose, UA: NEGATIVE mg/dL
Hgb urine dipstick: NEGATIVE
Ketones, ur: NEGATIVE mg/dL
LEUKOCYTES UA: NEGATIVE
Nitrite: NEGATIVE
PROTEIN: NEGATIVE mg/dL
SPECIFIC GRAVITY, URINE: 1.02 (ref 1.005–1.030)
Urobilinogen, UA: 1 mg/dL (ref 0.0–1.0)
pH: 6.5 (ref 5.0–8.0)

## 2014-09-25 LAB — WET PREP, GENITAL
Trich, Wet Prep: NONE SEEN
YEAST WET PREP: NONE SEEN

## 2014-09-25 LAB — PREGNANCY, URINE: PREG TEST UR: NEGATIVE

## 2014-09-25 MED ORDER — AZITHROMYCIN 1 G PO PACK
1.0000 g | PACK | Freq: Once | ORAL | Status: AC
  Administered 2014-09-25: 1 g via ORAL
  Filled 2014-09-25: qty 1

## 2014-09-25 MED ORDER — CEFTRIAXONE SODIUM 250 MG IJ SOLR
250.0000 mg | Freq: Once | INTRAMUSCULAR | Status: AC
  Administered 2014-09-25: 250 mg via INTRAMUSCULAR
  Filled 2014-09-25: qty 250

## 2014-09-25 MED ORDER — METRONIDAZOLE 500 MG PO TABS
500.0000 mg | ORAL_TABLET | Freq: Once | ORAL | Status: AC
  Administered 2014-09-25: 500 mg via ORAL
  Filled 2014-09-25: qty 1

## 2014-09-25 MED ORDER — LIDOCAINE HCL (PF) 1 % IJ SOLN
INTRAMUSCULAR | Status: AC
  Administered 2014-09-25: 0.9 mL
  Filled 2014-09-25: qty 5

## 2014-09-25 MED ORDER — METRONIDAZOLE 500 MG PO TABS: 500.0000 mg | ORAL_TABLET | Freq: Two times a day (BID) | ORAL | Status: DC
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
## 2014-09-25 NOTE — ED Notes (Signed)
D/c home with Rx x 1 for flagyl

## 2014-09-25 NOTE — Discharge Instructions (Signed)
Take flagyl for a week.   Safe sex practices.   You will be called for positive cultures. If you have either GC or chlamydia, your partner needs to be treated as well.   Follow up with your doctor.   Return to ER if you have fever, worse bleeding, more vaginal or rectal discharge Bacterial Vaginosis Bacterial vaginosis is a vaginal infection that occurs when the normal balance of bacteria in the vagina is disrupted. It results from an overgrowth of certain bacteria. This is the most common vaginal infection in women of childbearing age. Treatment is important to prevent complications, especially in pregnant women, as it can cause a premature delivery. CAUSES  Bacterial vaginosis is caused by an increase in harmful bacteria that are normally present in smaller amounts in the vagina. Several different kinds of bacteria can cause bacterial vaginosis. However, the reason that the condition develops is not fully understood. RISK FACTORS Certain activities or behaviors can put you at an increased risk of developing bacterial vaginosis, including:  Having a new sex partner or multiple sex partners.  Douching.  Using an intrauterine device (IUD) for contraception. Women do not get bacterial vaginosis from toilet seats, bedding, swimming pools, or contact with objects around them. SIGNS AND SYMPTOMS  Some women with bacterial vaginosis have no signs or symptoms. Common symptoms include:  Grey vaginal discharge.  A fishlike odor with discharge, especially after sexual intercourse.  Itching or burning of the vagina and vulva.  Burning or pain with urination. DIAGNOSIS  Your health care provider will take a medical history and examine the vagina for signs of bacterial vaginosis. A sample of vaginal fluid may be taken. Your health care provider will look at this sample under a microscope to check for bacteria and abnormal cells. A vaginal pH test may also be done.  TREATMENT  Bacterial vaginosis  may be treated with antibiotic medicines. These may be given in the form of a pill or a vaginal cream. A second round of antibiotics may be prescribed if the condition comes back after treatment.  HOME CARE INSTRUCTIONS   Only take over-the-counter or prescription medicines as directed by your health care provider.  If antibiotic medicine was prescribed, take it as directed. Make sure you finish it even if you start to feel better.  Do not have sex until treatment is completed.  Tell all sexual partners that you have a vaginal infection. They should see their health care provider and be treated if they have problems, such as a mild rash or itching.  Practice safe sex by using condoms and only having one sex partner. SEEK MEDICAL CARE IF:   Your symptoms are not improving after 3 days of treatment.  You have increased discharge or pain.  You have a fever. MAKE SURE YOU:   Understand these instructions.  Will watch your condition.  Will get help right away if you are not doing well or get worse. FOR MORE INFORMATION  Centers for Disease Control and Prevention, Division of STD Prevention: [employer]raiser[employer]raud[employer] American Sexual Health Association (ASHA): www.ashastd.org  Document Released: 09/09/2005 Document Revised: 06/30/2013 Document Reviewed: 04/21/2013 [facility] Patient Information 2015 [place], [place]. This information is not intended to replace advice given to you by your health care provider. Make sure you discuss any questions you have with your health care provider.

## 2014-09-25 NOTE — ED Notes (Signed)
MD at bedside. 

## 2014-09-25 NOTE — ED Notes (Signed)
Pt reports noticing thick white vaginal d/c the week before her cycle- Also c/o rectal discharge (yellow mucous) with BM

## 2014-09-28 LAB — GC/CHLAMYDIA PROBE AMP
CT Probe RNA: NEGATIVE
GC Probe RNA: NEGATIVE

## 2014-10-24 ENCOUNTER — Encounter (HOSPITAL_BASED_OUTPATIENT_CLINIC_OR_DEPARTMENT_OTHER): Payer: Self-pay | Admitting: *Deleted

## 2014-10-24 ENCOUNTER — Emergency Department (HOSPITAL_BASED_OUTPATIENT_CLINIC_OR_DEPARTMENT_OTHER)
Admission: EM | Admit: 2014-10-24 | Discharge: 2014-10-24 | Disposition: A | Discharge status: Home / Self Care | Payer: Medicaid Other | Attending: Emergency Medicine | Admitting: Emergency Medicine

## 2014-10-24 DIAGNOSIS — M1990 Unspecified osteoarthritis, unspecified site: Secondary | ICD-10-CM | POA: Insufficient documentation

## 2014-10-24 DIAGNOSIS — D500 Iron deficiency anemia secondary to blood loss (chronic): Secondary | ICD-10-CM | POA: Diagnosis not present

## 2014-10-24 DIAGNOSIS — Z91040 Latex allergy status: Secondary | ICD-10-CM | POA: Insufficient documentation

## 2014-10-24 DIAGNOSIS — F4310 Post-traumatic stress disorder, unspecified: Secondary | ICD-10-CM | POA: Insufficient documentation

## 2014-10-24 DIAGNOSIS — Z3202 Encounter for pregnancy test, result negative: Secondary | ICD-10-CM | POA: Diagnosis not present

## 2014-10-24 DIAGNOSIS — Z79899 Other long term (current) drug therapy: Secondary | ICD-10-CM | POA: Insufficient documentation

## 2014-10-24 DIAGNOSIS — F319 Bipolar disorder, unspecified: Secondary | ICD-10-CM | POA: Insufficient documentation

## 2014-10-24 DIAGNOSIS — R51 Headache: Secondary | ICD-10-CM | POA: Insufficient documentation

## 2014-10-24 DIAGNOSIS — F410 Panic disorder [episodic paroxysmal anxiety] without agoraphobia: Secondary | ICD-10-CM | POA: Insufficient documentation

## 2014-10-24 DIAGNOSIS — F209 Schizophrenia, unspecified: Secondary | ICD-10-CM | POA: Diagnosis not present

## 2014-10-24 DIAGNOSIS — N939 Abnormal uterine and vaginal bleeding, unspecified: Secondary | ICD-10-CM | POA: Diagnosis not present

## 2014-10-24 DIAGNOSIS — R0602 Shortness of breath: Secondary | ICD-10-CM | POA: Insufficient documentation

## 2014-10-24 DIAGNOSIS — Z8742 Personal history of other diseases of the female genital tract: Secondary | ICD-10-CM | POA: Insufficient documentation

## 2014-10-24 DIAGNOSIS — Z720 Tobacco use: Secondary | ICD-10-CM | POA: Diagnosis not present

## 2014-10-24 DIAGNOSIS — R102 Pelvic and perineal pain: Secondary | ICD-10-CM | POA: Diagnosis present

## 2014-10-24 LAB — CBC WITH DIFFERENTIAL/PLATELET
BASOS ABS: 0.1 10*3/uL (ref 0.0–0.1)
BASOS PCT: 1 % (ref 0–1)
EOS ABS: 0.1 10*3/uL (ref 0.0–0.7)
EOS PCT: 1 % (ref 0–5)
HCT: 21.9 % — ABNORMAL LOW (ref 36.0–46.0)
HEMOGLOBIN: 6.2 g/dL — AB (ref 12.0–15.0)
LYMPHS ABS: 2.5 10*3/uL (ref 0.7–4.0)
Lymphocytes Relative: 30 % (ref 12–46)
MCH: 21.8 pg — ABNORMAL LOW (ref 26.0–34.0)
MCHC: 28.3 g/dL — AB (ref 30.0–36.0)
MCV: 77.1 fL — AB (ref 78.0–100.0)
MONO ABS: 0.8 10*3/uL (ref 0.1–1.0)
Monocytes Relative: 10 % (ref 3–12)
Neutro Abs: 4.8 10*3/uL (ref 1.7–7.7)
Neutrophils Relative %: 58 % (ref 43–77)
Platelets: 303 10*3/uL (ref 150–400)
RBC: 2.84 MIL/uL — ABNORMAL LOW (ref 3.87–5.11)
RDW: 22.1 % — ABNORMAL HIGH (ref 11.5–15.5)
WBC: 8.3 10*3/uL (ref 4.0–10.5)

## 2014-10-24 LAB — BASIC METABOLIC PANEL
Anion gap: 3 — ABNORMAL LOW (ref 5–15)
BUN: 15 mg/dL (ref 6–23)
CO2: 25 mmol/L (ref 19–32)
CREATININE: 0.55 mg/dL (ref 0.50–1.10)
Calcium: 9 mg/dL (ref 8.4–10.5)
Chloride: 110 mmol/L (ref 96–112)
GFR calc non Af Amer: >90 mL/min (ref >90)
Glucose, Bld: 101 mg/dL — ABNORMAL HIGH (ref 70–99)
Potassium: 3.4 mmol/L — ABNORMAL LOW (ref 3.5–5.1)
Sodium: 138 mmol/L (ref 135–145)

## 2014-10-24 LAB — HCG, SERUM, QUALITATIVE: PREG SERUM: NEGATIVE

## 2014-10-24 MED ORDER — FERROUS SULFATE 325 (65 FE) MG PO TABS: 325.0000 mg | ORAL_TABLET | Freq: Two times a day (BID) | ORAL | Status: DC
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
## 2014-10-24 NOTE — ED Notes (Addendum)
Pelvic pain. She was seen by her MD for same and diagnosed with anemia. Her MD called her and told her to go to the ED. Headache, chest pain, sob, weakness. Pale. States she was last transfused in October and at that time her HGB was around 4. Feels the same today. She looks sick.

## 2014-10-24 NOTE — Discharge Instructions (Signed)
Iron Deficiency Anemia Anemia is a condition in which there are less red blood cells or hemoglobin in the blood than normal. Hemoglobin is the part of red blood cells that carries oxygen. Iron deficiency anemia is anemia caused by too little iron. It is the most common type of anemia. It may leave you tired and short of breath. CAUSES   Lack of iron in the diet.  Poor absorption of iron, as seen with intestinal disorders.  Intestinal bleeding.  Heavy periods. SIGNS AND SYMPTOMS  Mild anemia may not be noticeable. Symptoms may include:  Fatigue.  Headache.  Pale skin.  Weakness.  Tiredness.  Shortness of breath.  Dizziness.  Cold hands and feet.  Fast or irregular heartbeat. DIAGNOSIS  Diagnosis requires a thorough evaluation and physical exam by your health care provider. Blood tests are generally used to confirm iron deficiency anemia. Additional tests may be done to find the underlying cause of your anemia. These may include:  Testing for blood in the stool (fecal occult blood test).  A procedure to see inside the colon and rectum (colonoscopy).  A procedure to see inside the esophagus and stomach (endoscopy). TREATMENT  Iron deficiency anemia is treated by correcting the cause of the deficiency. Treatment may involve:  Adding iron-rich foods to your diet.  Taking iron supplements. Pregnant or breastfeeding women need to take extra iron because their normal diet usually does not provide the required amount.  Taking vitamins. Vitamin C improves the absorption of iron. Your health care provider may recommend that you take your iron tablets with a glass of orange juice or vitamin C supplement.  Medicines to make heavy menstrual flow lighter.  Surgery. HOME CARE INSTRUCTIONS   Take iron as directed by your health care provider.  If you cannot tolerate taking iron supplements by mouth, talk to your health care provider about taking them through a vein  (intravenously) or an injection into a muscle.  For the best iron absorption, iron supplements should be taken on an empty stomach. If you cannot tolerate them on an empty stomach, you may need to take them with food.  Do not drink milk or take antacids at the same time as your iron supplements. Milk and antacids may interfere with the absorption of iron.  Iron supplements can cause constipation. Make sure to include fiber in your diet to prevent constipation. A stool softener may also be recommended.  Take vitamins as directed by your health care provider.  Eat a diet rich in iron. Foods high in iron include liver, lean beef, whole-grain bread, eggs, dried fruit, and dark green leafy vegetables. SEEK IMMEDIATE MEDICAL CARE IF:   You faint. If this happens, do not drive. Call your local emergency services (911 in [place] if no other help is available.  You have chest pain.  You feel nauseous or vomit.  You have severe or increased shortness of breath with activity.  You feel weak.  You have a rapid heartbeat.  You have unexplained sweating.  You become light-headed when getting up from a chair or bed. MAKE SURE YOU:   Understand these instructions.  Will watch your condition.  Will get help right away if you are not doing well or get worse. Document Released: 09/06/2000 Document Revised: 09/14/2013 Document Reviewed: 05/17/2013 [employer] Patient Information 2015 [employer]. This information is not intended to replace advice given to you by your health care provider. Make sure you discuss any questions you have with your health care provider.  

## 2014-11-16 ENCOUNTER — Telehealth: Payer: Self-pay

## 2014-11-16 DIAGNOSIS — D259 Leiomyoma of uterus, unspecified: Secondary | ICD-10-CM

## 2014-11-16 NOTE — Telephone Encounter (Addendum)
Received a call from [place] stating patient was seen in October for [place] eval and an MRI was recommended-- patient did not want to do it at that time but wishes to now. They request MD put in MRI order so that it can be completed at [place] as patient is claustrophobic and cannot do it at the hospital. Informed her I would verify with provider and place orders once approved.   1400: Discussed patient with Dr. Elly Modena-- agreed to order MRI w/wo contrast. Order placed.

## 2014-12-04 ENCOUNTER — Emergency Department (HOSPITAL_BASED_OUTPATIENT_CLINIC_OR_DEPARTMENT_OTHER)
Admission: EM | Admit: 2014-12-04 | Discharge: 2014-12-04 | Disposition: A | Discharge status: Home / Self Care | Payer: Medicaid Other | Attending: Emergency Medicine | Admitting: Emergency Medicine

## 2014-12-04 ENCOUNTER — Inpatient Hospital Stay
Admission: RE | Admit: 2014-12-04 | Discharge: 2014-12-04 | Disposition: A | Discharge status: Home / Self Care | Payer: Medicaid Other | Source: Ambulatory Visit | Attending: Obstetrics and Gynecology | Admitting: Obstetrics and Gynecology

## 2014-12-04 ENCOUNTER — Encounter (HOSPITAL_BASED_OUTPATIENT_CLINIC_OR_DEPARTMENT_OTHER): Payer: Self-pay | Admitting: *Deleted

## 2014-12-04 DIAGNOSIS — Z91040 Latex allergy status: Secondary | ICD-10-CM | POA: Insufficient documentation

## 2014-12-04 DIAGNOSIS — Z720 Tobacco use: Secondary | ICD-10-CM | POA: Insufficient documentation

## 2014-12-04 DIAGNOSIS — Z8742 Personal history of other diseases of the female genital tract: Secondary | ICD-10-CM | POA: Insufficient documentation

## 2014-12-04 DIAGNOSIS — F909 Attention-deficit hyperactivity disorder, unspecified type: Secondary | ICD-10-CM | POA: Diagnosis not present

## 2014-12-04 DIAGNOSIS — Z3202 Encounter for pregnancy test, result negative: Secondary | ICD-10-CM | POA: Diagnosis not present

## 2014-12-04 DIAGNOSIS — F430 Acute stress reaction: Secondary | ICD-10-CM

## 2014-12-04 DIAGNOSIS — Z79899 Other long term (current) drug therapy: Secondary | ICD-10-CM | POA: Diagnosis not present

## 2014-12-04 DIAGNOSIS — R110 Nausea: Secondary | ICD-10-CM | POA: Diagnosis not present

## 2014-12-04 DIAGNOSIS — F329 Major depressive disorder, single episode, unspecified: Secondary | ICD-10-CM

## 2014-12-04 DIAGNOSIS — D259 Leiomyoma of uterus, unspecified: Secondary | ICD-10-CM

## 2014-12-04 DIAGNOSIS — D649 Anemia, unspecified: Secondary | ICD-10-CM | POA: Diagnosis not present

## 2014-12-04 DIAGNOSIS — F32A Depression, unspecified: Secondary | ICD-10-CM

## 2014-12-04 DIAGNOSIS — R45851 Suicidal ideations: Secondary | ICD-10-CM | POA: Diagnosis present

## 2014-12-04 LAB — URINALYSIS, ROUTINE W REFLEX MICROSCOPIC
Bilirubin Urine: NEGATIVE
Glucose, UA: NEGATIVE mg/dL
HGB URINE DIPSTICK: NEGATIVE
Ketones, ur: NEGATIVE mg/dL
Leukocytes, UA: NEGATIVE
Nitrite: NEGATIVE
PH: 5 (ref 5.0–8.0)
Protein, ur: NEGATIVE mg/dL
Specific Gravity, Urine: 1.035 — ABNORMAL HIGH (ref 1.005–1.030)
UROBILINOGEN UA: 0.2 mg/dL (ref 0.0–1.0)

## 2014-12-04 LAB — CBC WITH DIFFERENTIAL/PLATELET
BASOS ABS: 0 10*3/uL (ref 0.0–0.1)
Basophils Relative: 1 % (ref 0–1)
Eosinophils Absolute: 0.1 10*3/uL (ref 0.0–0.7)
Eosinophils Relative: 1 % (ref 0–5)
HCT: 33.6 % — ABNORMAL LOW (ref 36.0–46.0)
Hemoglobin: 10.1 g/dL — ABNORMAL LOW (ref 12.0–15.0)
LYMPHS PCT: 35 % (ref 12–46)
Lymphs Abs: 1.9 10*3/uL (ref 0.7–4.0)
MCH: 24.3 pg — ABNORMAL LOW (ref 26.0–34.0)
MCHC: 30.1 g/dL (ref 30.0–36.0)
MCV: 81 fL (ref 78.0–100.0)
MONOS PCT: 10 % (ref 3–12)
Monocytes Absolute: 0.5 10*3/uL (ref 0.1–1.0)
Neutro Abs: 2.9 10*3/uL (ref 1.7–7.7)
Neutrophils Relative %: 53 % (ref 43–77)
Platelets: 309 10*3/uL (ref 150–400)
RBC: 4.15 MIL/uL (ref 3.87–5.11)
RDW: 20.6 % — ABNORMAL HIGH (ref 11.5–15.5)
WBC: 5.5 10*3/uL (ref 4.0–10.5)

## 2014-12-04 LAB — COMPREHENSIVE METABOLIC PANEL
ALT: 15 U/L (ref 0–35)
AST: 20 U/L (ref 0–37)
Albumin: 4.3 g/dL (ref 3.5–5.2)
Alkaline Phosphatase: 57 U/L (ref 39–117)
Anion gap: 8 (ref 5–15)
BILIRUBIN TOTAL: 0.2 mg/dL — AB (ref 0.3–1.2)
BUN: 22 mg/dL (ref 6–23)
CO2: 25 mmol/L (ref 19–32)
CREATININE: 0.62 mg/dL (ref 0.50–1.10)
Calcium: 9.9 mg/dL (ref 8.4–10.5)
Chloride: 108 mmol/L (ref 96–112)
GFR calc Af Amer: >90 mL/min (ref >90)
GFR calc non Af Amer: >90 mL/min (ref >90)
Glucose, Bld: 99 mg/dL (ref 70–99)
Potassium: 3.4 mmol/L — ABNORMAL LOW (ref 3.5–5.1)
Sodium: 141 mmol/L (ref 135–145)
TOTAL PROTEIN: 7.3 g/dL (ref 6.0–8.3)

## 2014-12-04 LAB — ETHANOL: Alcohol, Ethyl (B): <5 mg/dL (ref 0–9)

## 2014-12-04 LAB — RAPID URINE DRUG SCREEN, HOSP PERFORMED
AMPHETAMINES: NOT DETECTED
BENZODIAZEPINES: POSITIVE — AB
Barbiturates: NOT DETECTED
COCAINE: NOT DETECTED
Opiates: NOT DETECTED
Tetrahydrocannabinol: POSITIVE — AB

## 2014-12-04 LAB — PREGNANCY, URINE: Preg Test, Ur: NEGATIVE

## 2014-12-04 NOTE — Discharge Instructions (Signed)
Please follow up with the list provided. Return if worsening thoughts and condition.    Stress and Stress Management Stress is a normal reaction to life events. It is what you feel when life demands more than you are used to or more than you can handle. Some stress can be useful. For example, the stress reaction can help you catch the last bus of the day, study for a test, or meet a deadline at work. But stress that occurs too often or for too long can cause problems. It can affect your emotional health and interfere with relationships and normal daily activities. Too much stress can weaken your immune system and increase your risk for physical illness. If you already have a medical problem, stress can make it worse. CAUSES  All sorts of life events may cause stress. An event that causes stress for one person may not be stressful for another person. Major life events commonly cause stress. These may be positive or negative. Examples include losing your job, moving into a new home, getting married, having a baby, or losing a loved one. Less obvious life events may also cause stress, especially if they occur day after day or in combination. Examples include working long hours, driving in traffic, caring for children, being in debt, or being in a difficult relationship. SIGNS AND SYMPTOMS Stress may cause emotional symptoms including, the following:  Anxiety. This is feeling worried, afraid, on edge, overwhelmed, or out of control.  Anger. This is feeling irritated or impatient.  Depression. This is feeling sad, down, helpless, or guilty.  Difficulty focusing, remembering, or making decisions. Stress may cause physical symptoms, including the following:   Aches and pains. These may affect your head, neck, back, stomach, or other areas of your body.  Tight muscles or clenched jaw.  Low energy or trouble sleeping. Stress may cause unhealthy behaviors, including the following:   Eating to feel  better (overeating) or skipping meals.  Sleeping too little, too much, or both.  Working too much or putting off tasks (procrastination).  Smoking, drinking alcohol, or using drugs to feel better. DIAGNOSIS  Stress is diagnosed through an assessment by your health care provider. Your health care provider will ask questions about your symptoms and any stressful life events.Your health care provider will also ask about your medical history and may order blood tests or other tests. Certain medical conditions and medicine can cause physical symptoms similar to stress. Mental illness can cause emotional symptoms and unhealthy behaviors similar to stress. Your health care provider may refer you to a mental health professional for further evaluation.  TREATMENT  Stress management is the recommended treatment for stress.The goals of stress management are reducing stressful life events and coping with stress in healthy ways.  Techniques for reducing stressful life events include the following:  Stress identification. Self-monitor for stress and identify what causes stress for you. These skills may help you to avoid some stressful events.  Time management. Set your priorities, keep a calendar of events, and learn to say "no." These tools can help you avoid making too many commitments. Techniques for coping with stress include the following:  Rethinking the problem. Try to think realistically about stressful events rather than ignoring them or overreacting. Try to find the positives in a stressful situation rather than focusing on the negatives.  Exercise. Physical exercise can release both physical and emotional tension. The key is to find a form of exercise you enjoy and do it regularly.  Relaxation techniques. These relax the body and mind. Examples include yoga, meditation, tai chi, biofeedback, deep breathing, progressive muscle relaxation, listening to music, being out in nature, journaling, and  other hobbies. Again, the key is to find one or more that you enjoy and can do regularly.  Healthy lifestyle. Eat a balanced diet, get plenty of sleep, and do not smoke. Avoid using alcohol or drugs to relax.  Strong support network. Spend time with family, friends, or other people you enjoy being around.Express your feelings and talk things over with someone you trust. Counseling or talktherapy with a mental health professional may be helpful if you are having difficulty managing stress on your own. Medicine is typically not recommended for the treatment of stress.Talk to your health care provider if you think you need medicine for symptoms of stress. HOME CARE INSTRUCTIONS  Keep all follow-up visits as directed by your health care provider.  Take all medicines as directed by your health care provider. SEEK MEDICAL CARE IF:  Your symptoms get worse or you start having new symptoms.  You feel overwhelmed by your problems and can no longer manage them on your own. SEEK IMMEDIATE MEDICAL CARE IF:  You feel like hurting yourself or someone else. Document Released: 03/05/2001 Document Revised: 01/24/2014 Document Reviewed: 05/04/2013 [facility] Patient Information 2015 [place], [place]. This information is not intended to replace advice given to you by your health care provider. Make sure you discuss any questions you have with your health care provider.
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
## 2014-12-04 NOTE — ED Notes (Signed)
All patient belongings returned to patient upon discharge.  Patient acknowledged receipt of all belongings and verbalized understanding of discharge paperwork and plan of care.  Pt denies any thoughts of or plan to harm herself or anyone else.

## 2014-12-04 NOTE — ED Notes (Signed)
TTS in progress at the bedside
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
## 2014-12-04 NOTE — ED Notes (Signed)
Pt appears tearful and anxious during assessment.  Pt states she is frustrated that she has not been able to see her son and that she has not been able to take her psych medications as prescribed.  Pt states she is taking classes in school and unable to focus on the assignments because of her unmedicated ADHD and OCD tendencies.  Pt is feeling overwhelmed and states that "maybe I should give up, but I don't want to give up.  I just want to get back on my medications and talk to a counselor who can help me."  Pt also states she has been engaging in permiscuous behaviors recently because she wants to get pregnant,  But that she is also feeling depressed that she has not yet gotten pregnant.  "I am almost 20 and running out of time and I keep wondering why I am not pregnant yet."  Pt expresses that she knows she should not be feeling as unfocused as she has been lately and that she thinks getting back on her medications will help, but that she does not have a doctor.

## 2014-12-05 ENCOUNTER — Telehealth: Payer: Self-pay | Admitting: *Deleted

## 2014-12-05 NOTE — Telephone Encounter (Signed)
lmom for pt to call and r/s the Mri appt she had for 12/04/14

## 2014-12-13 ENCOUNTER — Encounter (HOSPITAL_COMMUNITY): Payer: Self-pay | Admitting: Emergency Medicine

## 2014-12-13 ENCOUNTER — Emergency Department (HOSPITAL_COMMUNITY)
Admission: EM | Admit: 2014-12-13 | Discharge: 2014-12-13 | Disposition: A | Discharge status: Home / Self Care | Payer: No Typology Code available for payment source | Attending: Emergency Medicine | Admitting: Emergency Medicine

## 2014-12-13 DIAGNOSIS — Y92481 Parking lot as the place of occurrence of the external cause: Secondary | ICD-10-CM | POA: Insufficient documentation

## 2014-12-13 DIAGNOSIS — Z91040 Latex allergy status: Secondary | ICD-10-CM | POA: Diagnosis not present

## 2014-12-13 DIAGNOSIS — S3992XA Unspecified injury of lower back, initial encounter: Secondary | ICD-10-CM | POA: Insufficient documentation

## 2014-12-13 DIAGNOSIS — Y998 Other external cause status: Secondary | ICD-10-CM | POA: Diagnosis not present

## 2014-12-13 DIAGNOSIS — Z720 Tobacco use: Secondary | ICD-10-CM | POA: Insufficient documentation

## 2014-12-13 DIAGNOSIS — F209 Schizophrenia, unspecified: Secondary | ICD-10-CM | POA: Insufficient documentation

## 2014-12-13 DIAGNOSIS — D649 Anemia, unspecified: Secondary | ICD-10-CM | POA: Diagnosis not present

## 2014-12-13 DIAGNOSIS — Z8742 Personal history of other diseases of the female genital tract: Secondary | ICD-10-CM | POA: Diagnosis not present

## 2014-12-13 DIAGNOSIS — F319 Bipolar disorder, unspecified: Secondary | ICD-10-CM | POA: Diagnosis not present

## 2014-12-13 DIAGNOSIS — M62838 Other muscle spasm: Secondary | ICD-10-CM

## 2014-12-13 DIAGNOSIS — Z79899 Other long term (current) drug therapy: Secondary | ICD-10-CM | POA: Insufficient documentation

## 2014-12-13 DIAGNOSIS — Y9389 Activity, other specified: Secondary | ICD-10-CM | POA: Insufficient documentation

## 2014-12-13 DIAGNOSIS — F4310 Post-traumatic stress disorder, unspecified: Secondary | ICD-10-CM | POA: Diagnosis not present

## 2014-12-13 DIAGNOSIS — M542 Cervicalgia: Secondary | ICD-10-CM

## 2014-12-13 DIAGNOSIS — S199XXA Unspecified injury of neck, initial encounter: Secondary | ICD-10-CM | POA: Diagnosis not present

## 2014-12-13 MED ORDER — CYCLOBENZAPRINE HCL 10 MG PO TABS: 10.0000 mg | ORAL_TABLET | Freq: Three times a day (TID) | ORAL | Status: DC | PRN

## 2014-12-13 MED ORDER — OXYCODONE HCL 5 MG PO TABS: 5.0000 mg | ORAL_TABLET | Freq: Four times a day (QID) | ORAL | Status: DC | PRN

## 2014-12-13 MED ORDER — NAPROXEN 500 MG PO TABS
500.0000 mg | ORAL_TABLET | Freq: Once | ORAL | Status: AC
Start: 2014-12-13 — End: 2014-12-13
  Administered 2014-12-13: 500 mg via ORAL
  Filled 2014-12-13: qty 1

## 2014-12-13 MED ORDER — NAPROXEN 500 MG PO TABS: 500.0000 mg | ORAL_TABLET | Freq: Two times a day (BID) | ORAL | Status: DC | PRN
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
## 2014-12-13 NOTE — Discharge Instructions (Signed)
Take naprosyn as directed for inflammation and pain with roxicodone for breakthrough pain and flexeril for muscle relaxation. Do not drive or operate machinery with pain medication or muscle relaxation use. Use heat over the areas of soreness, no more than 20 minutes at a time for each. Expect to be sore for the next few days and follow up with primary care physician for recheck of ongoing symptoms. Return to ER for emergent changing or worsening of symptoms.     Musculoskeletal Pain Musculoskeletal pain is muscle and boney aches and pains. These pains can occur in any part of the body. Your caregiver may treat you without knowing the cause of the pain. They may treat you if blood or urine tests, X-rays, and other tests were normal.  CAUSES There is often not a definite cause or reason for these pains. These pains may be caused by a type of germ (virus). The discomfort may also come from overuse. Overuse includes working out too hard when your body is not fit. Boney aches also come from weather changes. Bone is sensitive to atmospheric pressure changes. HOME CARE INSTRUCTIONS   Ask when your test results will be ready. Make sure you get your test results.  Only take over-the-counter or prescription medicines for pain, discomfort, or fever as directed by your caregiver. If you were given medications for your condition, do not drive, operate machinery or power tools, or sign legal documents for 24 hours. Do not drink alcohol. Do not take sleeping pills or other medications that may interfere with treatment.  Continue all activities unless the activities cause more pain. When the pain lessens, slowly resume normal activities. Gradually increase the intensity and duration of the activities or exercise.  During periods of severe pain, bed rest may be helpful. Lay or sit in any position that is comfortable.  Putting ice on the injured area.  Put ice in a bag.  Place a towel between your skin and the  bag.  Leave the ice on for 15 to 20 minutes, 3 to 4 times a day.  Follow up with your caregiver for continued problems and no reason can be found for the pain. If the pain becomes worse or does not go away, it may be necessary to repeat tests or do additional testing. Your caregiver may need to look further for a possible cause. SEEK IMMEDIATE MEDICAL CARE IF:  You have pain that is getting worse and is not relieved by medications.  You develop chest pain that is associated with shortness or breath, sweating, feeling sick to your stomach (nauseous), or throw up (vomit).  Your pain becomes localized to the abdomen.  You develop any new symptoms that seem different or that concern you. MAKE SURE YOU:   Understand these instructions.  Will watch your condition.  Will get help right away if you are not doing well or get worse. Document Released: 09/09/2005 Document Revised: 12/02/2011 Document Reviewed: 05/14/2013 [facility] Patient Information 2015 [place], [place]. This information is not intended to replace advice given to you by your health care provider. Make sure you discuss any questions you have with your health care provider.  Muscle Cramps and Spasms Muscle cramps and spasms occur when a muscle or muscles tighten and you have no control over this tightening (involuntary muscle contraction). They are a common problem and can develop in any muscle. The most common place is in the calf muscles of the leg. Both muscle cramps and muscle spasms are involuntary muscle contractions,  but they also have differences:   Muscle cramps are sporadic and painful. They may last a few seconds to a quarter of an hour. Muscle cramps are often more forceful and last longer than muscle spasms.  Muscle spasms may or may not be painful. They may also last just a few seconds or much longer. CAUSES  It is uncommon for cramps or spasms to be due to a serious underlying problem. In many cases, the cause of  cramps or spasms is unknown. Some common causes are:   Overexertion.   Overuse from repetitive motions (doing the same thing over and over).   Remaining in a certain position for a long period of time.   Improper preparation, form, or technique while performing a sport or activity.   Dehydration.   Injury.   Side effects of some medicines.   Abnormally low levels of the salts and ions in your blood (electrolytes), especially potassium and calcium. This could happen if you are taking water pills (diuretics) or you are pregnant.  Some underlying medical problems can make it more likely to develop cramps or spasms. These include, but are not limited to:   Diabetes.   Parkinson disease.   Hormone disorders, such as thyroid problems.   Alcohol abuse.   Diseases specific to muscles, joints, and bones.   Blood vessel disease where not enough blood is getting to the muscles.  HOME CARE INSTRUCTIONS   Stay well hydrated. Drink enough water and fluids to keep your urine clear or pale yellow.  It may be helpful to massage, stretch, and relax the affected muscle.  For tight or tense muscles, use a warm towel, heating pad, or hot shower water directed to the affected area.  If you are sore or have pain after a cramp or spasm, applying ice to the affected area may relieve discomfort.  Put ice in a plastic bag.  Place a towel between your skin and the bag.  Leave the ice on for 15-20 minutes, 03-04 times a day.  Medicines used to treat a known cause of cramps or spasms may help reduce their frequency or severity. Only take over-the-counter or prescription medicines as directed by your caregiver. SEEK MEDICAL CARE IF:  Your cramps or spasms get more severe, more frequent, or do not improve over time.  MAKE SURE YOU:   Understand these instructions.  Will watch your condition.  Will get help right away if you are not doing well or get worse. Document Released:  03/01/2002 Document Revised: 01/04/2013 Document Reviewed: 08/26/2012 [facility] Patient Information 2015 [place], [place]. This information is not intended to replace advice given to you by your health care provider. Make sure you discuss any questions you have with your health care provider.  Heat Therapy Heat therapy can help ease sore, stiff, injured, and tight muscles and joints. Heat relaxes your muscles, which may help ease your pain.  RISKS AND COMPLICATIONS If you have any of the following conditions, do not use heat therapy unless your health care provider has approved:  Poor circulation.  Healing wounds or scarred skin in the area being treated.  Diabetes, heart disease, or high blood pressure.  Not being able to feel (numbness) the area being treated.  Unusual swelling of the area being treated.  Active infections.  Blood clots.  Cancer.  Inability to communicate pain. This may include young children and people who have problems with their brain function (dementia).  Pregnancy. Heat therapy should only be used on  old, pre-existing, or long-lasting (chronic) injuries. Do not use heat therapy on new injuries unless directed by your health care provider. HOW TO USE HEAT THERAPY There are several different kinds of heat therapy, including:  Moist heat pack.  Warm water bath.  Hot water bottle.  Electric heating pad.  Heated gel pack.  Heated wrap.  Electric heating pad. Use the heat therapy method suggested by your health care provider. Follow your health care provider's instructions on when and how to use heat therapy. GENERAL HEAT THERAPY RECOMMENDATIONS  Do not sleep while using heat therapy. Only use heat therapy while you are awake.  Your skin may turn pink while using heat therapy. Do not use heat therapy if your skin turns red.  Do not use heat therapy if you have new pain.  High heat or long exposure to heat can cause burns. Be careful when using heat  therapy to avoid burning your skin.  Do not use heat therapy on areas of your skin that are already irritated, such as with a rash or sunburn. SEEK MEDICAL CARE IF:  You have blisters, redness, swelling, or numbness.  You have new pain.  Your pain is worse. MAKE SURE YOU:  Understand these instructions.  Will watch your condition.  Will get help right away if you are not doing well or get worse. Document Released: 12/02/2011 Document Revised: 01/24/2014 Document Reviewed: 11/02/2013 [facility] Patient Information 2015 [place], [place]. This information is not intended to replace advice given to you by your health care provider. Make sure you discuss any questions you have with your health care provider.
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
## 2014-12-24 ENCOUNTER — Emergency Department (HOSPITAL_BASED_OUTPATIENT_CLINIC_OR_DEPARTMENT_OTHER)
Admission: EM | Admit: 2014-12-24 | Discharge: 2014-12-24 | Disposition: A | Discharge status: Home / Self Care | Payer: No Typology Code available for payment source | Attending: Emergency Medicine | Admitting: Emergency Medicine

## 2014-12-24 ENCOUNTER — Encounter (HOSPITAL_BASED_OUTPATIENT_CLINIC_OR_DEPARTMENT_OTHER): Payer: Self-pay

## 2014-12-24 DIAGNOSIS — F410 Panic disorder [episodic paroxysmal anxiety] without agoraphobia: Secondary | ICD-10-CM | POA: Insufficient documentation

## 2014-12-24 DIAGNOSIS — Z8742 Personal history of other diseases of the female genital tract: Secondary | ICD-10-CM | POA: Insufficient documentation

## 2014-12-24 DIAGNOSIS — D649 Anemia, unspecified: Secondary | ICD-10-CM | POA: Diagnosis not present

## 2014-12-24 DIAGNOSIS — Z79899 Other long term (current) drug therapy: Secondary | ICD-10-CM | POA: Insufficient documentation

## 2014-12-24 DIAGNOSIS — Z8739 Personal history of other diseases of the musculoskeletal system and connective tissue: Secondary | ICD-10-CM | POA: Diagnosis not present

## 2014-12-24 DIAGNOSIS — Y9389 Activity, other specified: Secondary | ICD-10-CM | POA: Insufficient documentation

## 2014-12-24 DIAGNOSIS — F209 Schizophrenia, unspecified: Secondary | ICD-10-CM | POA: Diagnosis not present

## 2014-12-24 DIAGNOSIS — Y92410 Unspecified street and highway as the place of occurrence of the external cause: Secondary | ICD-10-CM | POA: Diagnosis not present

## 2014-12-24 DIAGNOSIS — F909 Attention-deficit hyperactivity disorder, unspecified type: Secondary | ICD-10-CM | POA: Insufficient documentation

## 2014-12-24 DIAGNOSIS — Y998 Other external cause status: Secondary | ICD-10-CM | POA: Diagnosis not present

## 2014-12-24 DIAGNOSIS — S199XXA Unspecified injury of neck, initial encounter: Secondary | ICD-10-CM | POA: Diagnosis present

## 2014-12-24 DIAGNOSIS — Z720 Tobacco use: Secondary | ICD-10-CM | POA: Diagnosis not present

## 2014-12-24 DIAGNOSIS — F4310 Post-traumatic stress disorder, unspecified: Secondary | ICD-10-CM | POA: Diagnosis not present

## 2014-12-24 DIAGNOSIS — Z91040 Latex allergy status: Secondary | ICD-10-CM | POA: Insufficient documentation

## 2014-12-24 DIAGNOSIS — F319 Bipolar disorder, unspecified: Secondary | ICD-10-CM | POA: Insufficient documentation

## 2014-12-24 DIAGNOSIS — S46912A Strain of unspecified muscle, fascia and tendon at shoulder and upper arm level, left arm, initial encounter: Secondary | ICD-10-CM | POA: Insufficient documentation

## 2014-12-24 DIAGNOSIS — S46812A Strain of other muscles, fascia and tendons at shoulder and upper arm level, left arm, initial encounter: Secondary | ICD-10-CM

## 2014-12-24 MED ORDER — DIAZEPAM 5 MG PO TABS: 5.0000 mg | ORAL_TABLET | Freq: Three times a day (TID) | ORAL | Status: DC | PRN

## 2014-12-24 NOTE — ED Notes (Signed)
Patient here with ongoing left sided neck pain since last visit, thinks she needs a c-collar for immobilization or support, no new trauma
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
## 2014-12-24 NOTE — ED Notes (Signed)
MD at bedside. 

## 2014-12-24 NOTE — Discharge Instructions (Signed)
Take valium for muscle spasms. Try heating pads. Stretches several times a day. Please make sure that your desk gets fixed so that your screen is right in front of your face.   Muscle Strain A muscle strain is an injury that occurs when a muscle is stretched beyond its normal length. Usually a small number of muscle fibers are torn when this happens. Muscle strain is rated in degrees. First-degree strains have the least amount of muscle fiber tearing and pain. Second-degree and third-degree strains have increasingly more tearing and pain.  Usually, recovery from muscle strain takes 1-2 weeks. Complete healing takes 5-6 weeks.  CAUSES  Muscle strain happens when a sudden, violent force placed on a muscle stretches it too far. This may occur with lifting, sports, or a fall.  RISK FACTORS Muscle strain is especially common in athletes.  SIGNS AND SYMPTOMS At the site of the muscle strain, there may be:  Pain.  Bruising.  Swelling.  Difficulty using the muscle due to pain or lack of normal function. DIAGNOSIS  Your health care provider will perform a physical exam and ask about your medical history. TREATMENT  Often, the best treatment for a muscle strain is resting, icing, and applying cold compresses to the injured area.  HOME CARE INSTRUCTIONS   Use the PRICE method of treatment to promote muscle healing during the first 2-3 days after your injury. The PRICE method involves:  Protecting the muscle from being injured again.  Restricting your activity and resting the injured body part.  Icing your injury. To do this, put ice in a plastic bag. Place a towel between your skin and the bag. Then, apply the ice and leave it on from 15-20 minutes each hour. After the third day, switch to moist heat packs.  Apply compression to the injured area with a splint or elastic bandage. Be careful not to wrap it too tightly. This may interfere with blood circulation or increase swelling.  Elevate  the injured body part above the level of your heart as often as you can.  Only take over-the-counter or prescription medicines for pain, discomfort, or fever as directed by your health care provider.  Warming up prior to exercise helps to prevent future muscle strains. SEEK MEDICAL CARE IF:   You have increasing pain or swelling in the injured area.  You have numbness, tingling, or a significant loss of strength in the injured area. MAKE SURE YOU:   Understand these instructions.  Will watch your condition.  Will get help right away if you are not doing well or get worse. Document Released: 09/09/2005 Document Revised: 06/30/2013 Document Reviewed: 04/08/2013 [facility] Patient Information 2015 [place], [place]. This information is not intended to replace advice given to you by your health care provider. Make sure you discuss any questions you have with your health care provider.

## 2014-12-26 ENCOUNTER — Inpatient Hospital Stay: Admission: RE | Admit: 2014-12-26 | Payer: Medicaid Other | Source: Ambulatory Visit

## 2014-12-30 ENCOUNTER — Inpatient Hospital Stay: Admission: RE | Admit: 2014-12-30 | Payer: Medicaid Other | Source: Ambulatory Visit

## 2015-01-05 ENCOUNTER — Encounter (HOSPITAL_BASED_OUTPATIENT_CLINIC_OR_DEPARTMENT_OTHER): Payer: Self-pay | Admitting: *Deleted

## 2015-01-05 ENCOUNTER — Emergency Department (HOSPITAL_BASED_OUTPATIENT_CLINIC_OR_DEPARTMENT_OTHER)
Admission: EM | Admit: 2015-01-05 | Discharge: 2015-01-05 | Disposition: A | Discharge status: Home / Self Care | Payer: Medicaid Other | Attending: Emergency Medicine | Admitting: Emergency Medicine

## 2015-01-05 ENCOUNTER — Emergency Department (HOSPITAL_BASED_OUTPATIENT_CLINIC_OR_DEPARTMENT_OTHER): Payer: Medicaid Other

## 2015-01-05 DIAGNOSIS — F909 Attention-deficit hyperactivity disorder, unspecified type: Secondary | ICD-10-CM | POA: Diagnosis not present

## 2015-01-05 DIAGNOSIS — D649 Anemia, unspecified: Secondary | ICD-10-CM | POA: Diagnosis not present

## 2015-01-05 DIAGNOSIS — Z792 Long term (current) use of antibiotics: Secondary | ICD-10-CM | POA: Insufficient documentation

## 2015-01-05 DIAGNOSIS — F410 Panic disorder [episodic paroxysmal anxiety] without agoraphobia: Secondary | ICD-10-CM | POA: Diagnosis not present

## 2015-01-05 DIAGNOSIS — F4310 Post-traumatic stress disorder, unspecified: Secondary | ICD-10-CM | POA: Diagnosis not present

## 2015-01-05 DIAGNOSIS — Z3202 Encounter for pregnancy test, result negative: Secondary | ICD-10-CM | POA: Diagnosis not present

## 2015-01-05 DIAGNOSIS — D259 Leiomyoma of uterus, unspecified: Secondary | ICD-10-CM | POA: Diagnosis not present

## 2015-01-05 DIAGNOSIS — Z91040 Latex allergy status: Secondary | ICD-10-CM | POA: Insufficient documentation

## 2015-01-05 DIAGNOSIS — Z79899 Other long term (current) drug therapy: Secondary | ICD-10-CM | POA: Insufficient documentation

## 2015-01-05 DIAGNOSIS — R1031 Right lower quadrant pain: Secondary | ICD-10-CM

## 2015-01-05 DIAGNOSIS — F319 Bipolar disorder, unspecified: Secondary | ICD-10-CM | POA: Insufficient documentation

## 2015-01-05 DIAGNOSIS — Z87891 Personal history of nicotine dependence: Secondary | ICD-10-CM | POA: Diagnosis not present

## 2015-01-05 DIAGNOSIS — R112 Nausea with vomiting, unspecified: Secondary | ICD-10-CM

## 2015-01-05 DIAGNOSIS — F42 Obsessive-compulsive disorder: Secondary | ICD-10-CM | POA: Diagnosis not present

## 2015-01-05 DIAGNOSIS — N938 Other specified abnormal uterine and vaginal bleeding: Secondary | ICD-10-CM | POA: Insufficient documentation

## 2015-01-05 DIAGNOSIS — F209 Schizophrenia, unspecified: Secondary | ICD-10-CM | POA: Insufficient documentation

## 2015-01-05 DIAGNOSIS — Z8742 Personal history of other diseases of the female genital tract: Secondary | ICD-10-CM | POA: Diagnosis not present

## 2015-01-05 DIAGNOSIS — M1990 Unspecified osteoarthritis, unspecified site: Secondary | ICD-10-CM | POA: Insufficient documentation

## 2015-01-05 DIAGNOSIS — N939 Abnormal uterine and vaginal bleeding, unspecified: Secondary | ICD-10-CM

## 2015-01-05 LAB — PREGNANCY, URINE: Preg Test, Ur: NEGATIVE

## 2015-01-05 LAB — URINALYSIS, ROUTINE W REFLEX MICROSCOPIC
BILIRUBIN URINE: NEGATIVE
Glucose, UA: NEGATIVE mg/dL
Ketones, ur: >80 mg/dL — AB
Leukocytes, UA: NEGATIVE
Nitrite: NEGATIVE
PH: 6 (ref 5.0–8.0)
Protein, ur: 30 mg/dL — AB
SPECIFIC GRAVITY, URINE: 1.029 (ref 1.005–1.030)
Urobilinogen, UA: 1 mg/dL (ref 0.0–1.0)

## 2015-01-05 LAB — CBC
HCT: 33.8 % — ABNORMAL LOW (ref 36.0–46.0)
Hemoglobin: 10.5 g/dL — ABNORMAL LOW (ref 12.0–15.0)
MCH: 25.7 pg — ABNORMAL LOW (ref 26.0–34.0)
MCHC: 31.1 g/dL (ref 30.0–36.0)
MCV: 82.8 fL (ref 78.0–100.0)
Platelets: 307 10*3/uL (ref 150–400)
RBC: 4.08 MIL/uL (ref 3.87–5.11)
RDW: 20.2 % — ABNORMAL HIGH (ref 11.5–15.5)
WBC: 8 10*3/uL (ref 4.0–10.5)

## 2015-01-05 LAB — COMPREHENSIVE METABOLIC PANEL
ALT: 17 U/L (ref 0–35)
AST: 22 U/L (ref 0–37)
Albumin: 4.4 g/dL (ref 3.5–5.2)
Alkaline Phosphatase: 62 U/L (ref 39–117)
Anion gap: 8 (ref 5–15)
BILIRUBIN TOTAL: 0.4 mg/dL (ref 0.3–1.2)
BUN: 13 mg/dL (ref 6–23)
CALCIUM: 9.7 mg/dL (ref 8.4–10.5)
CHLORIDE: 102 mmol/L (ref 96–112)
CO2: 26 mmol/L (ref 19–32)
CREATININE: 0.55 mg/dL (ref 0.50–1.10)
GFR calc Af Amer: >90 mL/min (ref >90)
GFR calc non Af Amer: >90 mL/min (ref >90)
Glucose, Bld: 108 mg/dL — ABNORMAL HIGH (ref 70–99)
Potassium: 3.4 mmol/L — ABNORMAL LOW (ref 3.5–5.1)
Sodium: 136 mmol/L (ref 135–145)
Total Protein: 8.2 g/dL (ref 6.0–8.3)

## 2015-01-05 LAB — URINE MICROSCOPIC-ADD ON

## 2015-01-05 LAB — WET PREP, GENITAL
Clue Cells Wet Prep HPF POC: NONE SEEN
Trich, Wet Prep: NONE SEEN
YEAST WET PREP: NONE SEEN

## 2015-01-05 MED ORDER — ONDANSETRON 4 MG PO TBDP: ORAL_TABLET | ORAL | Status: DC

## 2015-01-05 MED ORDER — SODIUM CHLORIDE 0.9 % IV BOLUS (SEPSIS)
1000.0000 mL | Freq: Once | INTRAVENOUS | Status: AC
  Administered 2015-01-05: 1000 mL via INTRAVENOUS

## 2015-01-05 MED ORDER — IOHEXOL 300 MG/ML  SOLN
25.0000 mL | Freq: Once | INTRAMUSCULAR | Status: AC | PRN
  Administered 2015-01-05: 25 mL via ORAL

## 2015-01-05 MED ORDER — IOHEXOL 300 MG/ML  SOLN
100.0000 mL | Freq: Once | INTRAMUSCULAR | Status: AC | PRN
  Administered 2015-01-05: 100 mL via INTRAVENOUS

## 2015-01-05 MED ORDER — KETOROLAC TROMETHAMINE 30 MG/ML IJ SOLN
30.0000 mg | Freq: Once | INTRAMUSCULAR | Status: AC
  Administered 2015-01-05: 30 mg via INTRAVENOUS
  Filled 2015-01-05: qty 1

## 2015-01-05 MED ORDER — PROMETHAZINE HCL 25 MG/ML IJ SOLN
25.0000 mg | Freq: Once | INTRAMUSCULAR | Status: AC
  Administered 2015-01-05: 25 mg via INTRAVENOUS

## 2015-01-05 MED ORDER — PROMETHAZINE HCL 25 MG/ML IJ SOLN
INTRAMUSCULAR | Status: DC
Start: 2015-01-05 — End: 2015-01-06
  Filled 2015-01-05: qty 1

## 2015-01-05 MED ORDER — ONDANSETRON HCL 4 MG/2ML IJ SOLN
INTRAMUSCULAR | Status: AC
  Filled 2015-01-05: qty 2

## 2015-01-05 MED ORDER — MORPHINE SULFATE 4 MG/ML IJ SOLN
4.0000 mg | Freq: Once | INTRAMUSCULAR | Status: AC
  Administered 2015-01-05: 4 mg via INTRAVENOUS
  Filled 2015-01-05: qty 1

## 2015-01-05 MED ORDER — POTASSIUM CHLORIDE CRYS ER 20 MEQ PO TBCR
40.0000 meq | EXTENDED_RELEASE_TABLET | Freq: Once | ORAL | Status: AC
  Administered 2015-01-05: 40 meq via ORAL
  Filled 2015-01-05: qty 2

## 2015-01-05 MED ORDER — ONDANSETRON 4 MG PO TBDP
4.0000 mg | ORAL_TABLET | Freq: Once | ORAL | Status: AC
  Administered 2015-01-05: 4 mg via ORAL
  Filled 2015-01-05: qty 1

## 2015-01-05 MED ORDER — PANTOPRAZOLE SODIUM 40 MG IV SOLR
40.0000 mg | Freq: Once | INTRAVENOUS | Status: AC
  Administered 2015-01-05: 40 mg via INTRAVENOUS
  Filled 2015-01-05: qty 40

## 2015-01-05 MED ORDER — PROMETHAZINE HCL 25 MG RE SUPP: 25.0000 mg | Freq: Four times a day (QID) | RECTAL | Status: DC | PRN

## 2015-01-05 MED ORDER — ONDANSETRON HCL 4 MG/2ML IJ SOLN
4.0000 mg | Freq: Once | INTRAMUSCULAR | Status: AC
  Administered 2015-01-05: 4 mg via INTRAVENOUS

## 2015-01-05 NOTE — ED Notes (Signed)
Pt noted restless, diaphoretic, resp shallow and rapid. Pt encouraged to slow her breathing, will not comply. Pt states "I just hurt in my chest, it's burning on fire!!" pt assisted to reposition by Jenny Reichmann, emt and this rn. Pa alerted to change in pt status.

## 2015-01-05 NOTE — ED Notes (Signed)
Pt amb to restroom with instructions for clean catch urine sample. Pt states she is unable to void at this time and will notify staff when she is able to produce a specimen.

## 2015-01-05 NOTE — ED Notes (Signed)
Patient transported to Ultrasound 

## 2015-01-05 NOTE — ED Notes (Signed)
Pt given ginger ale for po challenge. Assisted to reposition for comfort.

## 2015-01-05 NOTE — ED Notes (Signed)
Told pt several times with RN in the room not to get out of bed call light in place . Noted pt all over the bed and hyperventilating.

## 2015-01-05 NOTE — ED Notes (Signed)
Pelvic cart at bedside. 
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
## 2015-01-05 NOTE — ED Notes (Signed)
Pt returned to room from ct, side rails up and call bell in reach

## 2015-01-05 NOTE — ED Notes (Signed)
Pt reports she is feeling dizzy and weak- poor appetite- menstrual cycle is heavier than usual- vomited multiple times last night- left work because she's feeling so bad

## 2015-01-05 NOTE — Discharge Instructions (Signed)
Return to the emergency room with worsening of symptoms, new symptoms or with symptoms that are concerning , especially fevers, abdominal pain in one area, unable to keep down fluids, blood in stool or vomit, severe pain, you feel faint, lightheaded or pass out. Call to make an appointment as soon as possible with [facility] for further workup. You see attached resources to establish care with a primary care provider. Read below information and follow recommendations. Uterine Fibroid A uterine fibroid is a growth (tumor) that occurs in your uterus. This type of tumor is not cancerous and does not spread out of the uterus. You can have one or many fibroids. Fibroids can vary in size, weight, and where they grow in the uterus. Some can become quite large. Most fibroids do not require medical treatment, but some can cause pain or heavy bleeding during and between periods. CAUSES  A fibroid is the result of a single uterine cell that keeps growing (unregulated), which is different than most cells in the human body. Most cells have a control mechanism that keeps them from reproducing without control.  SIGNS AND SYMPTOMS   Bleeding.  Pelvic pain and pressure.  Bladder problems due to the size of the fibroid.  Infertility and miscarriages depending on the size and location of the fibroid. DIAGNOSIS  Uterine fibroids are diagnosed through a physical exam. Your health care provider may feel the lumpy tumors during a pelvic exam. Ultrasonography may be done to get information regarding size, location, and number of tumors.  TREATMENT   Your health care provider may recommend watchful waiting. This involves getting the fibroid checked by your health care provider to see if it grows or shrinks.   Hormone treatment or an intrauterine device (IUD) may be prescribed.   Surgery may be needed to remove the fibroids (myomectomy) or the uterus (hysterectomy). This depends on your situation. When  fibroids interfere with fertility and a woman wants to become pregnant, a health care provider may recommend having the fibroids removed.  [place] care depends on how you were treated. In general:   Keep all follow-up appointments with your health care provider.   Only take over-the-counter or prescription medicines as directed by your health care provider. If you were prescribed a hormone treatment, take the hormone medicines exactly as directed. Do not take aspirin. It can cause bleeding.   Talk to your health care provider about taking iron pills.  If your periods are troublesome but not so heavy, lie down with your feet raised slightly above your heart. Place cold packs on your lower abdomen.   If your periods are heavy, write down the number of pads or tampons you use per month. Bring this information to your health care provider.   Include green vegetables in your diet.  SEEK IMMEDIATE MEDICAL CARE IF:  You have pelvic pain or cramps not controlled with medicines.   You have a sudden increase in pelvic pain.   You have an increase in bleeding between and during periods.   You have excessive periods and soak tampons or pads in a half hour or less.  You feel lightheaded or have fainting episodes. Document Released: 09/06/2000 Document Revised: 06/30/2013 Document Reviewed: 04/08/2013 [facility] Patient Information 2015 [place], [place]. This information is not intended to replace advice given to you by your health care provider. Make sure you discuss any questions you have with your health care provider.

## 2015-01-06 LAB — GC/CHLAMYDIA PROBE AMP (~~LOC~~) NOT AT ARMC
CHLAMYDIA, DNA PROBE: NEGATIVE
Neisseria Gonorrhea: NEGATIVE

## 2015-01-27 ENCOUNTER — Emergency Department (HOSPITAL_BASED_OUTPATIENT_CLINIC_OR_DEPARTMENT_OTHER)
Admission: EM | Admit: 2015-01-27 | Discharge: 2015-01-27 | Disposition: A | Discharge status: Home / Self Care | Payer: Medicaid Other | Attending: Emergency Medicine | Admitting: Emergency Medicine

## 2015-01-27 ENCOUNTER — Encounter (HOSPITAL_BASED_OUTPATIENT_CLINIC_OR_DEPARTMENT_OTHER): Payer: Self-pay | Admitting: Emergency Medicine

## 2015-01-27 DIAGNOSIS — Z79899 Other long term (current) drug therapy: Secondary | ICD-10-CM | POA: Insufficient documentation

## 2015-01-27 DIAGNOSIS — Z91040 Latex allergy status: Secondary | ICD-10-CM | POA: Diagnosis not present

## 2015-01-27 DIAGNOSIS — Z8739 Personal history of other diseases of the musculoskeletal system and connective tissue: Secondary | ICD-10-CM | POA: Insufficient documentation

## 2015-01-27 DIAGNOSIS — Z87891 Personal history of nicotine dependence: Secondary | ICD-10-CM | POA: Insufficient documentation

## 2015-01-27 DIAGNOSIS — F42 Obsessive-compulsive disorder: Secondary | ICD-10-CM | POA: Diagnosis not present

## 2015-01-27 DIAGNOSIS — F209 Schizophrenia, unspecified: Secondary | ICD-10-CM | POA: Insufficient documentation

## 2015-01-27 DIAGNOSIS — F319 Bipolar disorder, unspecified: Secondary | ICD-10-CM | POA: Diagnosis not present

## 2015-01-27 DIAGNOSIS — L293 Anogenital pruritus, unspecified: Secondary | ICD-10-CM | POA: Diagnosis present

## 2015-01-27 DIAGNOSIS — Z86018 Personal history of other benign neoplasm: Secondary | ICD-10-CM | POA: Insufficient documentation

## 2015-01-27 DIAGNOSIS — Z3202 Encounter for pregnancy test, result negative: Secondary | ICD-10-CM | POA: Diagnosis not present

## 2015-01-27 DIAGNOSIS — B379 Candidiasis, unspecified: Secondary | ICD-10-CM | POA: Diagnosis not present

## 2015-01-27 DIAGNOSIS — F4310 Post-traumatic stress disorder, unspecified: Secondary | ICD-10-CM | POA: Insufficient documentation

## 2015-01-27 DIAGNOSIS — D649 Anemia, unspecified: Secondary | ICD-10-CM | POA: Diagnosis not present

## 2015-01-27 LAB — URINALYSIS, ROUTINE W REFLEX MICROSCOPIC
BILIRUBIN URINE: NEGATIVE
Glucose, UA: NEGATIVE mg/dL
Hgb urine dipstick: NEGATIVE
Ketones, ur: NEGATIVE mg/dL
LEUKOCYTES UA: NEGATIVE
NITRITE: NEGATIVE
PH: 6.5 (ref 5.0–8.0)
Protein, ur: NEGATIVE mg/dL
SPECIFIC GRAVITY, URINE: 1.006 (ref 1.005–1.030)
Urobilinogen, UA: 0.2 mg/dL (ref 0.0–1.0)

## 2015-01-27 LAB — GC/CHLAMYDIA PROBE AMP (~~LOC~~) NOT AT ARMC
Chlamydia: NEGATIVE
NEISSERIA GONORRHEA: NEGATIVE

## 2015-01-27 LAB — WET PREP, GENITAL
Clue Cells Wet Prep HPF POC: NONE SEEN
Trich, Wet Prep: NONE SEEN

## 2015-01-27 LAB — PREGNANCY, URINE: PREG TEST UR: NEGATIVE

## 2015-01-27 MED ORDER — FLUCONAZOLE 50 MG PO TABS
150.0000 mg | ORAL_TABLET | Freq: Once | ORAL | Status: AC
  Administered 2015-01-27: 150 mg via ORAL
  Filled 2015-01-27 (×2): qty 1
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
## 2015-01-27 NOTE — ED Notes (Signed)
Onset x 4 days  Vaginal itching   And burning

## 2015-01-27 NOTE — Discharge Instructions (Signed)

## 2015-01-28 LAB — HIV ANTIBODY (ROUTINE TESTING W REFLEX): HIV SCREEN 4TH GENERATION: NONREACTIVE

## 2015-01-28 LAB — RPR: RPR: NONREACTIVE

## 2015-03-05 ENCOUNTER — Other Ambulatory Visit: Payer: Self-pay | Admitting: Obstetrics & Gynecology

## 2015-04-10 ENCOUNTER — Encounter (HOSPITAL_BASED_OUTPATIENT_CLINIC_OR_DEPARTMENT_OTHER): Payer: Self-pay | Admitting: *Deleted

## 2015-04-10 ENCOUNTER — Emergency Department (HOSPITAL_BASED_OUTPATIENT_CLINIC_OR_DEPARTMENT_OTHER): Payer: Medicaid Other

## 2015-04-10 ENCOUNTER — Emergency Department (HOSPITAL_BASED_OUTPATIENT_CLINIC_OR_DEPARTMENT_OTHER)
Admission: EM | Admit: 2015-04-10 | Discharge: 2015-04-10 | Disposition: A | Discharge status: Home / Self Care | Payer: Medicaid Other | Attending: Emergency Medicine | Admitting: Emergency Medicine

## 2015-04-10 DIAGNOSIS — F410 Panic disorder [episodic paroxysmal anxiety] without agoraphobia: Secondary | ICD-10-CM | POA: Insufficient documentation

## 2015-04-10 DIAGNOSIS — Z86018 Personal history of other benign neoplasm: Secondary | ICD-10-CM | POA: Insufficient documentation

## 2015-04-10 DIAGNOSIS — N939 Abnormal uterine and vaginal bleeding, unspecified: Secondary | ICD-10-CM | POA: Diagnosis not present

## 2015-04-10 DIAGNOSIS — F909 Attention-deficit hyperactivity disorder, unspecified type: Secondary | ICD-10-CM | POA: Diagnosis not present

## 2015-04-10 DIAGNOSIS — F209 Schizophrenia, unspecified: Secondary | ICD-10-CM | POA: Insufficient documentation

## 2015-04-10 DIAGNOSIS — R1031 Right lower quadrant pain: Secondary | ICD-10-CM | POA: Insufficient documentation

## 2015-04-10 DIAGNOSIS — F319 Bipolar disorder, unspecified: Secondary | ICD-10-CM | POA: Diagnosis not present

## 2015-04-10 DIAGNOSIS — Z91040 Latex allergy status: Secondary | ICD-10-CM | POA: Insufficient documentation

## 2015-04-10 DIAGNOSIS — Z79899 Other long term (current) drug therapy: Secondary | ICD-10-CM | POA: Diagnosis not present

## 2015-04-10 DIAGNOSIS — D649 Anemia, unspecified: Secondary | ICD-10-CM | POA: Diagnosis not present

## 2015-04-10 DIAGNOSIS — F42 Obsessive-compulsive disorder: Secondary | ICD-10-CM | POA: Insufficient documentation

## 2015-04-10 DIAGNOSIS — M1990 Unspecified osteoarthritis, unspecified site: Secondary | ICD-10-CM | POA: Diagnosis not present

## 2015-04-10 DIAGNOSIS — Z87891 Personal history of nicotine dependence: Secondary | ICD-10-CM | POA: Insufficient documentation

## 2015-04-10 DIAGNOSIS — F4310 Post-traumatic stress disorder, unspecified: Secondary | ICD-10-CM | POA: Insufficient documentation

## 2015-04-10 DIAGNOSIS — Z3202 Encounter for pregnancy test, result negative: Secondary | ICD-10-CM | POA: Diagnosis not present

## 2015-04-10 HISTORY — DX: Essential (primary) hypertension: I10

## 2015-04-10 LAB — CBC WITH DIFFERENTIAL/PLATELET
Basophils Absolute: 0.1 10*3/uL (ref 0.0–0.1)
Basophils Relative: 1 % (ref 0–1)
EOS PCT: 1 % (ref 0–5)
Eosinophils Absolute: 0.1 10*3/uL (ref 0.0–0.7)
HEMATOCRIT: 26.9 % — AB (ref 36.0–46.0)
Hemoglobin: 7.8 g/dL — ABNORMAL LOW (ref 12.0–15.0)
LYMPHS PCT: 26 % (ref 12–46)
Lymphs Abs: 1.6 10*3/uL (ref 0.7–4.0)
MCH: 22 pg — AB (ref 26.0–34.0)
MCHC: 29 g/dL — ABNORMAL LOW (ref 30.0–36.0)
MCV: 76 fL — ABNORMAL LOW (ref 78.0–100.0)
MONOS PCT: 7 % (ref 3–12)
Monocytes Absolute: 0.4 10*3/uL (ref 0.1–1.0)
Neutro Abs: 4 10*3/uL (ref 1.7–7.7)
Neutrophils Relative %: 65 % (ref 43–77)
PLATELETS: 344 10*3/uL (ref 150–400)
RBC: 3.54 MIL/uL — AB (ref 3.87–5.11)
RDW: 20.8 % — AB (ref 11.5–15.5)
WBC: 6.2 10*3/uL (ref 4.0–10.5)

## 2015-04-10 LAB — COMPREHENSIVE METABOLIC PANEL
ALBUMIN: 3.9 g/dL (ref 3.5–5.0)
ALK PHOS: 49 U/L (ref 38–126)
ALT: 10 U/L — AB (ref 14–54)
ANION GAP: 6 (ref 5–15)
AST: 15 U/L (ref 15–41)
BILIRUBIN TOTAL: 0.4 mg/dL (ref 0.3–1.2)
BUN: 8 mg/dL (ref 6–20)
CHLORIDE: 105 mmol/L (ref 101–111)
CO2: 24 mmol/L (ref 22–32)
CREATININE: 0.56 mg/dL (ref 0.44–1.00)
Calcium: 9.1 mg/dL (ref 8.9–10.3)
GFR calc Af Amer: >60 mL/min (ref >60)
GFR calc non Af Amer: >60 mL/min (ref >60)
GLUCOSE: 87 mg/dL (ref 65–99)
Potassium: 3.6 mmol/L (ref 3.5–5.1)
Sodium: 135 mmol/L (ref 135–145)
TOTAL PROTEIN: 7 g/dL (ref 6.5–8.1)

## 2015-04-10 LAB — URINALYSIS, ROUTINE W REFLEX MICROSCOPIC
BILIRUBIN URINE: NEGATIVE
Glucose, UA: NEGATIVE mg/dL
Hgb urine dipstick: NEGATIVE
KETONES UR: NEGATIVE mg/dL
LEUKOCYTES UA: NEGATIVE
NITRITE: NEGATIVE
PH: 6 (ref 5.0–8.0)
PROTEIN: NEGATIVE mg/dL
Specific Gravity, Urine: 1.01 (ref 1.005–1.030)
Urobilinogen, UA: 0.2 mg/dL (ref 0.0–1.0)

## 2015-04-10 LAB — PREGNANCY, URINE: PREG TEST UR: NEGATIVE

## 2015-04-10 LAB — WET PREP, GENITAL
Trich, Wet Prep: NONE SEEN
YEAST WET PREP: NONE SEEN

## 2015-04-10 MED ORDER — IOHEXOL 300 MG/ML  SOLN
25.0000 mL | Freq: Once | INTRAMUSCULAR | Status: AC | PRN
  Administered 2015-04-10: 25 mL via ORAL

## 2015-04-10 MED ORDER — OXYCODONE-ACETAMINOPHEN 5-325 MG PO TABS: 1.0000 | ORAL_TABLET | Freq: Four times a day (QID) | ORAL | Status: DC | PRN

## 2015-04-10 MED ORDER — HYDROMORPHONE HCL 1 MG/ML IJ SOLN
0.5000 mg | Freq: Once | INTRAMUSCULAR | Status: AC
  Administered 2015-04-10: 0.5 mg via INTRAVENOUS
  Filled 2015-04-10: qty 1

## 2015-04-10 MED ORDER — IOHEXOL 300 MG/ML  SOLN
100.0000 mL | Freq: Once | INTRAMUSCULAR | Status: AC | PRN
  Administered 2015-04-10: 100 mL via INTRAVENOUS

## 2015-04-10 NOTE — ED Notes (Signed)
Right lower quadrant pain and long menses lasting 40 days.
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
## 2015-04-10 NOTE — Discharge Instructions (Signed)
Abdominal Pain, Women °Abdominal (stomach, pelvic, or belly) pain can be caused by many things. It is important to tell your doctor: °· The location of the pain. °· Does it come and go or is it present all the time? °· Are there things that start the pain (eating certain foods, exercise)? °· Are there other symptoms associated with the pain (fever, nausea, vomiting, diarrhea)? °All of this is helpful to know when trying to find the cause of the pain. °CAUSES  °· Stomach: virus or bacteria infection, or ulcer. °· Intestine: appendicitis (inflamed appendix), regional ileitis (Crohn's disease), ulcerative colitis (inflamed colon), irritable bowel syndrome, diverticulitis (inflamed diverticulum of the colon), or cancer of the stomach or intestine. °· Gallbladder disease or stones in the gallbladder. °· Kidney disease, kidney stones, or infection. °· Pancreas infection or cancer. °· Fibromyalgia (pain disorder). °· Diseases of the female organs: °¨ Uterus: fibroid (non-cancerous) tumors or infection. °¨ Fallopian tubes: infection or tubal pregnancy. °¨ Ovary: cysts or tumors. °¨ Pelvic adhesions (scar tissue). °¨ Endometriosis (uterus lining tissue growing in the pelvis and on the pelvic organs). °¨ Pelvic congestion syndrome (female organs filling up with blood just before the menstrual period). °¨ Pain with the menstrual period. °¨ Pain with ovulation (producing an egg). °¨ Pain with an IUD (intrauterine device, birth control) in the uterus. °¨ Cancer of the female organs. °· Functional pain (pain not caused by a disease, may improve without treatment). °· Psychological pain. °· Depression. °DIAGNOSIS  °Your doctor will decide the seriousness of your pain by doing an examination. °· Blood tests. °· X-rays. °· Ultrasound. °· CT scan (computed tomography, special type of X-ray). °· MRI (magnetic resonance imaging). °· Cultures, for infection. °· Barium enema (dye inserted in the large intestine, to better view it with  X-rays). °· Colonoscopy (looking in intestine with a lighted tube). °· Laparoscopy (minor surgery, looking in abdomen with a lighted tube). °· Major abdominal exploratory surgery (looking in abdomen with a large incision). °TREATMENT  °The treatment will depend on the cause of the pain.  °· Many cases can be observed and treated at home. °· Over-the-counter medicines recommended by your caregiver. °· Prescription medicine. °· Antibiotics, for infection. °· Birth control pills, for painful periods or for ovulation pain. °· Hormone treatment, for endometriosis. °· Nerve blocking injections. °· Physical therapy. °· Antidepressants. °· Counseling with a psychologist or psychiatrist. °· Minor or major surgery. °HOME CARE INSTRUCTIONS  °· Do not take laxatives, unless directed by your caregiver. °· Take over-the-counter pain medicine only if ordered by your caregiver. Do not take aspirin because it can cause an upset stomach or bleeding. °· Try a clear liquid diet (broth or water) as ordered by your caregiver. Slowly move to a bland diet, as tolerated, if the pain is related to the stomach or intestine. °· Have a thermometer and take your temperature several times a day, and record it. °· Bed rest and sleep, if it helps the pain. °· Avoid sexual intercourse, if it causes pain. °· Avoid stressful situations. °· Keep your follow-up appointments and tests, as your caregiver orders. °· If the pain does not go away with medicine or surgery, you may try: °¨ Acupuncture. °¨ Relaxation exercises (yoga, meditation). °¨ Group therapy. °¨ Counseling. °SEEK MEDICAL CARE IF:  °· You notice certain foods cause stomach pain. °· Your home care treatment is not helping your pain. °· You need stronger pain medicine. °· You want your IUD removed. °· You feel faint or   lightheaded. °· You develop nausea and vomiting. °· You develop a rash. °· You are having side effects or an allergy to your medicine. °SEEK IMMEDIATE MEDICAL CARE IF:  °· Your  pain does not go away or gets worse. °· You have a fever. °· Your pain is felt only in portions of the abdomen. The right side could possibly be appendicitis. The left lower portion of the abdomen could be colitis or diverticulitis. °· You are passing blood in your stools (bright red or black tarry stools, with or without vomiting). °· You have blood in your urine. °· You develop chills, with or without a fever. °· You pass out. °MAKE SURE YOU:  °· Understand these instructions. °· Will watch your condition. °· Will get help right away if you are not doing well or get worse. °Document Released: 07/07/2007 Document Revised: 01/24/2014 Document Reviewed: 07/27/2009 °[employer]® Patient Information ©2015 [employer]. This information is not intended to replace advice given to you by your health care provider. Make sure you discuss any questions you have with your health care provider. ° °

## 2015-04-11 LAB — GC/CHLAMYDIA PROBE AMP (~~LOC~~) NOT AT ARMC
Chlamydia: NEGATIVE
NEISSERIA GONORRHEA: NEGATIVE

## 2015-04-25 ENCOUNTER — Other Ambulatory Visit: Payer: Self-pay | Admitting: Advanced Practice Midwife

## 2015-05-21 ENCOUNTER — Other Ambulatory Visit: Payer: Self-pay | Admitting: Obstetrics & Gynecology

## 2015-06-13 ENCOUNTER — Telehealth: Payer: Self-pay | Admitting: General Practice

## 2015-06-13 NOTE — Telephone Encounter (Signed)
Patient called and left message stating she is calling about her bleeding issues. Patient states she has been bleeding for 30 days even with her nuvaring in. Patient states she doesn't know if she needs to leave her nuvaring in or take it out. Patient states when she has sex, she has an overload of blood come out. Patient states she has an appt on 10/13 and that is too far from now. Patient requests call back. Per chart review patient was seen last 9/15 with Dr Elly Modena. Dr Elly Modena offered patient surgery for management but patient declined and was told to RTC when she was ready. Per chart review, patient no showed to multiple MRI appts. Patient should be encouraged to call [facility] to reschedule MRI prior to appt in our office. Called patient, no answer- left message stating we are trying to reach you to return your phone call, please call us back at the clinics

## 2015-06-14 ENCOUNTER — Encounter: Payer: Self-pay | Admitting: *Deleted

## 2015-06-14 NOTE — Telephone Encounter (Signed)
Attempted to contact patient. No answer, left voice mail stating she should return our call at the clinic. Will send letter. Letter sent.

## 2015-07-06 ENCOUNTER — Encounter: Payer: Self-pay | Admitting: Obstetrics and Gynecology

## 2015-07-06 ENCOUNTER — Ambulatory Visit (INDEPENDENT_AMBULATORY_CARE_PROVIDER_SITE_OTHER): Payer: Medicaid Other | Admitting: Obstetrics and Gynecology

## 2015-07-06 DIAGNOSIS — D259 Leiomyoma of uterus, unspecified: Secondary | ICD-10-CM
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
## 2015-07-06 NOTE — Progress Notes (Signed)
Patient ID: Sara Yates, female   DOB: 1971-08-04, 44 y.o.   MRN: [id number] present today to discuss her vaginal bleeding and to discuss fibroids.

## 2015-07-10 ENCOUNTER — Other Ambulatory Visit: Payer: Self-pay | Admitting: Obstetrics

## 2015-07-12 ENCOUNTER — Other Ambulatory Visit: Payer: Self-pay | Admitting: *Deleted

## 2015-07-12 MED ORDER — ETONOGESTREL-ETHINYL ESTRADIOL 0.12-0.015 MG/24HR VA RING: VAGINAL_RING | VAGINAL | Status: DC

## 2015-07-12 NOTE — Telephone Encounter (Signed)
Called Sara Yates and she states Dr. Elly Modena had said she would prescribe refills. I informed Sara Yates I did not see any mention of that in her notes from 07/06/15 visit, but I did see that Dr. Jodi Mourning had sent in a refill yesterday and it should be at her pharmacy . She states she had told pharmacy to do that since she used to there,but she still needs refill  In 3 weeks. I informed her I would send a request to Dr. Elly Modena and if there were any issues we would call her.

## 2015-07-12 NOTE — Telephone Encounter (Signed)
Patient called stating she had been to see Dr Elly Modena who had said she would refill her NuvaRing prescription. However the pharmacy did not have a refill order. Needs to change out her ring tomorrow. Please refill and call to let her know.

## 2015-07-19 ENCOUNTER — Inpatient Hospital Stay: Admission: RE | Admit: 2015-07-19 | Payer: Medicaid Other | Source: Ambulatory Visit

## 2015-07-27 ENCOUNTER — Other Ambulatory Visit: Payer: Self-pay | Admitting: Obstetrics and Gynecology

## 2015-07-28 ENCOUNTER — Inpatient Hospital Stay: Admission: RE | Admit: 2015-07-28 | Payer: Medicaid Other | Source: Ambulatory Visit

## 2015-08-10 ENCOUNTER — Ambulatory Visit
Admission: RE | Admit: 2015-08-10 | Discharge: 2015-08-10 | Disposition: A | Discharge status: Home / Self Care | Payer: Medicaid Other | Source: Ambulatory Visit | Attending: Obstetrics and Gynecology | Admitting: Obstetrics and Gynecology

## 2015-08-10 DIAGNOSIS — D259 Leiomyoma of uterus, unspecified: Secondary | ICD-10-CM

## 2015-08-10 MED ORDER — GADOBENATE DIMEGLUMINE 529 MG/ML IV SOLN
16.0000 mL | Freq: Once | INTRAVENOUS | Status: AC | PRN
  Administered 2015-08-10: 16 mL via INTRAVENOUS

## 2015-08-14 ENCOUNTER — Other Ambulatory Visit: Payer: Self-pay | Admitting: Interventional Radiology

## 2015-08-14 DIAGNOSIS — D251 Intramural leiomyoma of uterus: Secondary | ICD-10-CM

## 2015-08-15 ENCOUNTER — Inpatient Hospital Stay (HOSPITAL_COMMUNITY)
Admission: AD | Admit: 2015-08-15 | Discharge: 2015-08-15 | Disposition: A | Discharge status: Home / Self Care | Payer: Medicaid Other | Source: Ambulatory Visit | Attending: Obstetrics & Gynecology | Admitting: Obstetrics & Gynecology

## 2015-08-15 ENCOUNTER — Encounter (HOSPITAL_COMMUNITY): Payer: Self-pay | Admitting: *Deleted

## 2015-08-15 DIAGNOSIS — N939 Abnormal uterine and vaginal bleeding, unspecified: Secondary | ICD-10-CM | POA: Diagnosis not present

## 2015-08-15 DIAGNOSIS — D649 Anemia, unspecified: Secondary | ICD-10-CM | POA: Insufficient documentation

## 2015-08-15 DIAGNOSIS — I10 Essential (primary) hypertension: Secondary | ICD-10-CM

## 2015-08-15 DIAGNOSIS — R51 Headache: Secondary | ICD-10-CM | POA: Diagnosis not present

## 2015-08-15 DIAGNOSIS — D62 Acute posthemorrhagic anemia: Secondary | ICD-10-CM

## 2015-08-15 LAB — CBC
HCT: 31.7 % — ABNORMAL LOW (ref 36.0–46.0)
HEMOGLOBIN: 9.9 g/dL — AB (ref 12.0–15.0)
MCH: 26.5 pg (ref 26.0–34.0)
MCHC: 31.2 g/dL (ref 30.0–36.0)
MCV: 85 fL (ref 78.0–100.0)
Platelets: 320 10*3/uL (ref 150–400)
RBC: 3.73 MIL/uL — ABNORMAL LOW (ref 3.87–5.11)
RDW: 20 % — ABNORMAL HIGH (ref 11.5–15.5)
WBC: 6.2 10*3/uL (ref 4.0–10.5)

## 2015-08-15 LAB — URINALYSIS, ROUTINE W REFLEX MICROSCOPIC
Bilirubin Urine: NEGATIVE
Glucose, UA: NEGATIVE mg/dL
Hgb urine dipstick: NEGATIVE
Ketones, ur: NEGATIVE mg/dL
Leukocytes, UA: NEGATIVE
Nitrite: NEGATIVE
PROTEIN: NEGATIVE mg/dL
Specific Gravity, Urine: 1.02 (ref 1.005–1.030)
pH: 6.5 (ref 5.0–8.0)

## 2015-08-15 LAB — POCT PREGNANCY, URINE: Preg Test, Ur: NEGATIVE

## 2015-08-15 MED ORDER — FERROUS SULFATE 325 (65 FE) MG PO TABS: 325.0000 mg | ORAL_TABLET | Freq: Two times a day (BID) | ORAL | Status: AC

## 2015-08-15 MED ORDER — ETONOGESTREL-ETHINYL ESTRADIOL 0.12-0.015 MG/24HR VA RING
VAGINAL_RING | VAGINAL | Status: DC
Start: 2015-08-15 — End: 2015-10-31

## 2015-08-15 MED ORDER — OXYCODONE-ACETAMINOPHEN 5-325 MG PO TABS: 1.0000 | ORAL_TABLET | Freq: Four times a day (QID) | ORAL | Status: DC | PRN

## 2015-08-15 MED ORDER — MEDROXYPROGESTERONE ACETATE 10 MG PO TABS: 10.0000 mg | ORAL_TABLET | Freq: Every day | ORAL | Status: DC
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
## 2015-08-15 NOTE — MAU Note (Signed)
Pt presents to MAU with complaints of a severe headache. States she passed a large clot on November the 12th and another clot on the 19th.with vaginal bleeding that continues but is scant at this time.  Pt states she was suppose to replace her nova ring and it was not at the pharmacy when it was due to be changed. Reports a severe headache. Anemic and had a blood transfusion last November

## 2015-08-15 NOTE — Discharge Instructions (Signed)
Abnormal Uterine Bleeding Abnormal uterine bleeding means bleeding from the vagina that is not your normal menstrual period. This can be:  Bleeding or spotting between periods.  Bleeding after sex (sexual intercourse).  Bleeding that is heavier or more than normal.  Periods that last longer than usual.  Bleeding after menopause. There are many problems that may cause this. Treatment will depend on the cause of the bleeding. Any kind of bleeding that is not normal should be reviewed by your doctor.  HOME CARE Watch your condition for any changes. These actions may lessen any discomfort you are having:  Do not use tampons or douches as told by your doctor.  Change your pads often. You should get regular pelvic exams and Pap tests. Keep all appointments for tests as told by your doctor. GET HELP IF:  You are bleeding for more than 1 week.  You feel dizzy at times. GET HELP RIGHT AWAY IF:   You pass out.  You have to change pads every 15 to 30 minutes.  You have belly pain.  You have a fever.  You become sweaty or weak.  You are passing large blood clots from the vagina.  You feel sick to your stomach (nauseous) and throw up (vomit). MAKE SURE YOU:  Understand these instructions.  Will watch your condition.  Will get help right away if you are not doing well or get worse.   This information is not intended to replace advice given to you by your health care provider. Make sure you discuss any questions you have with your health care provider.   Document Released: 07/07/2009 Document Revised: 09/14/2013 Document Reviewed: 04/08/2013 Elsevier Interactive Patient Education 2016 [employer]. Hypertension Hypertension, commonly called high blood pressure, is when the force of blood pumping through your arteries is too strong. Your arteries are the blood vessels that carry blood from your heart throughout your body. A blood pressure reading consists of a higher number over  a lower number, such as 110/72. The higher number (systolic) is the pressure inside your arteries when your heart pumps. The lower number (diastolic) is the pressure inside your arteries when your heart relaxes. Ideally you want your blood pressure below 120/80. Hypertension forces your heart to work harder to pump blood. Your arteries may become narrow or stiff. Having untreated or uncontrolled hypertension can cause heart attack, stroke, kidney disease, and other problems. RISK FACTORS Some risk factors for high blood pressure are controllable. Others are not.  Risk factors you cannot control include:   Race. You may be at higher risk if you are African American.  Age. Risk increases with age.  Gender. Men are at higher risk than women before age 26 years. After age 47, women are at higher risk than men. Risk factors you can control include:  Not getting enough exercise or physical activity.  Being overweight.  Getting too much fat, sugar, calories, or salt in your diet.  Drinking too much alcohol. SIGNS AND SYMPTOMS Hypertension does not usually cause signs or symptoms. Extremely high blood pressure (hypertensive crisis) may cause headache, anxiety, shortness of breath, and nosebleed. DIAGNOSIS To check if you have hypertension, your health care provider will measure your blood pressure while you are seated, with your arm held at the level of your heart. It should be measured at least twice using the same arm. Certain conditions can cause a difference in blood pressure between your right and left arms. A blood pressure reading that is higher than normal  on one occasion does not mean that you need treatment. If it is not clear whether you have high blood pressure, you may be asked to return on a different day to have your blood pressure checked again. Or, you may be asked to monitor your blood pressure at home for 1 or more weeks. TREATMENT Treating high blood pressure includes making  lifestyle changes and possibly taking medicine. Living a healthy lifestyle can help lower high blood pressure. You may need to change some of your habits. Lifestyle changes may include:  Following the DASH diet. This diet is high in fruits, vegetables, and whole grains. It is low in salt, red meat, and added sugars.  Keep your sodium intake below 2,300 mg per day.  Getting at least 30-45 minutes of aerobic exercise at least 4 times per week.  Losing weight if necessary.  Not smoking.  Limiting alcoholic beverages.  Learning ways to reduce stress. Your health care provider may prescribe medicine if lifestyle changes are not enough to get your blood pressure under control, and if one of the following is true:  You are 95-60 years of age and your systolic blood pressure is above 140.  You are 105 years of age or older, and your systolic blood pressure is above 150.  Your diastolic blood pressure is above 90.  You have diabetes, and your systolic blood pressure is over [id number] or your diastolic blood pressure is over 90.  You have kidney disease and your blood pressure is above 140/90.  You have heart disease and your blood pressure is above 140/90. Your personal target blood pressure may vary depending on your medical conditions, your age, and other factors. HOME CARE INSTRUCTIONS  Have your blood pressure rechecked as directed by your health care provider.   Take medicines only as directed by your health care provider. Follow the directions carefully. Blood pressure medicines must be taken as prescribed. The medicine does not work as well when you skip doses. Skipping doses also puts you at risk for problems.  Do not smoke.   Monitor your blood pressure at home as directed by your health care provider. SEEK MEDICAL CARE IF:   You think you are having a reaction to medicines taken.  You have recurrent headaches or feel dizzy.  You have swelling in your ankles.  You have  trouble with your vision. SEEK IMMEDIATE MEDICAL CARE IF:  You develop a severe headache or confusion.  You have unusual weakness, numbness, or feel faint.  You have severe chest or abdominal pain.  You vomit repeatedly.  You have trouble breathing. MAKE SURE YOU:   Understand these instructions.  Will watch your condition.  Will get help right away if you are not doing well or get worse.   This information is not intended to replace advice given to you by your health care provider. Make sure you discuss any questions you have with your health care provider.   Document Released: 09/09/2005 Document Revised: 01/24/2015 Document Reviewed: 07/02/2013 Elsevier Interactive Patient Education [employer].

## 2015-09-07 ENCOUNTER — Other Ambulatory Visit: Payer: Self-pay | Admitting: Obstetrics & Gynecology

## 2015-09-11 ENCOUNTER — Other Ambulatory Visit: Payer: Self-pay | Admitting: Radiology

## 2015-09-12 ENCOUNTER — Other Ambulatory Visit (HOSPITAL_COMMUNITY): Payer: Medicaid Other

## 2015-09-12 ENCOUNTER — Ambulatory Visit (HOSPITAL_COMMUNITY): Payer: Medicaid Other

## 2015-10-31 ENCOUNTER — Emergency Department (HOSPITAL_BASED_OUTPATIENT_CLINIC_OR_DEPARTMENT_OTHER)
Admission: EM | Admit: 2015-10-31 | Discharge: 2015-10-31 | Disposition: A | Discharge status: Home / Self Care | Payer: Medicaid Other | Attending: Emergency Medicine | Admitting: Emergency Medicine

## 2015-10-31 ENCOUNTER — Encounter (HOSPITAL_BASED_OUTPATIENT_CLINIC_OR_DEPARTMENT_OTHER): Payer: Self-pay | Admitting: *Deleted

## 2015-10-31 DIAGNOSIS — D649 Anemia, unspecified: Secondary | ICD-10-CM | POA: Insufficient documentation

## 2015-10-31 DIAGNOSIS — R197 Diarrhea, unspecified: Secondary | ICD-10-CM | POA: Insufficient documentation

## 2015-10-31 DIAGNOSIS — F3110 Bipolar disorder, current episode manic without psychotic features, unspecified: Secondary | ICD-10-CM | POA: Diagnosis not present

## 2015-10-31 DIAGNOSIS — R1030 Lower abdominal pain, unspecified: Secondary | ICD-10-CM | POA: Insufficient documentation

## 2015-10-31 DIAGNOSIS — F429 Obsessive-compulsive disorder, unspecified: Secondary | ICD-10-CM | POA: Insufficient documentation

## 2015-10-31 DIAGNOSIS — Z3202 Encounter for pregnancy test, result negative: Secondary | ICD-10-CM | POA: Insufficient documentation

## 2015-10-31 DIAGNOSIS — Z79899 Other long term (current) drug therapy: Secondary | ICD-10-CM | POA: Insufficient documentation

## 2015-10-31 DIAGNOSIS — F4310 Post-traumatic stress disorder, unspecified: Secondary | ICD-10-CM | POA: Insufficient documentation

## 2015-10-31 DIAGNOSIS — F410 Panic disorder [episodic paroxysmal anxiety] without agoraphobia: Secondary | ICD-10-CM | POA: Diagnosis not present

## 2015-10-31 DIAGNOSIS — R112 Nausea with vomiting, unspecified: Secondary | ICD-10-CM | POA: Diagnosis not present

## 2015-10-31 DIAGNOSIS — Z91040 Latex allergy status: Secondary | ICD-10-CM | POA: Diagnosis not present

## 2015-10-31 DIAGNOSIS — I10 Essential (primary) hypertension: Secondary | ICD-10-CM | POA: Insufficient documentation

## 2015-10-31 DIAGNOSIS — Z86018 Personal history of other benign neoplasm: Secondary | ICD-10-CM | POA: Insufficient documentation

## 2015-10-31 DIAGNOSIS — Z87891 Personal history of nicotine dependence: Secondary | ICD-10-CM | POA: Insufficient documentation

## 2015-10-31 LAB — CBC WITH DIFFERENTIAL/PLATELET
Basophils Absolute: 0 10*3/uL (ref 0.0–0.1)
Basophils Relative: 1 %
Eosinophils Absolute: 0.1 10*3/uL (ref 0.0–0.7)
Eosinophils Relative: 2 %
HCT: 29.6 % — ABNORMAL LOW (ref 36.0–46.0)
Hemoglobin: 8.8 g/dL — ABNORMAL LOW (ref 12.0–15.0)
Lymphocytes Relative: 22 %
Lymphs Abs: 1.2 10*3/uL (ref 0.7–4.0)
MCH: 24.5 pg — ABNORMAL LOW (ref 26.0–34.0)
MCHC: 29.7 g/dL — ABNORMAL LOW (ref 30.0–36.0)
MCV: 82.5 fL (ref 78.0–100.0)
Monocytes Absolute: 0.6 10*3/uL (ref 0.1–1.0)
Monocytes Relative: 11 %
Neutro Abs: 3.6 10*3/uL (ref 1.7–7.7)
Neutrophils Relative %: 64 %
Platelets: 166 10*3/uL (ref 150–400)
RBC: 3.59 MIL/uL — ABNORMAL LOW (ref 3.87–5.11)
RDW: 17.9 % — ABNORMAL HIGH (ref 11.5–15.5)
WBC: 5.6 10*3/uL (ref 4.0–10.5)

## 2015-10-31 LAB — URINALYSIS, ROUTINE W REFLEX MICROSCOPIC
BILIRUBIN URINE: NEGATIVE
GLUCOSE, UA: NEGATIVE mg/dL
HGB URINE DIPSTICK: NEGATIVE
KETONES UR: NEGATIVE mg/dL
LEUKOCYTES UA: NEGATIVE
Nitrite: NEGATIVE
PROTEIN: NEGATIVE mg/dL
Specific Gravity, Urine: 1.025 (ref 1.005–1.030)
pH: 5 (ref 5.0–8.0)

## 2015-10-31 LAB — COMPREHENSIVE METABOLIC PANEL
ALT: 17 U/L (ref 14–54)
AST: 19 U/L (ref 15–41)
Albumin: 3.8 g/dL (ref 3.5–5.0)
Alkaline Phosphatase: 67 U/L (ref 38–126)
Anion gap: 7 (ref 5–15)
BUN: 11 mg/dL (ref 6–20)
CO2: 23 mmol/L (ref 22–32)
Calcium: 9 mg/dL (ref 8.9–10.3)
Chloride: 111 mmol/L (ref 101–111)
Creatinine, Ser: 0.57 mg/dL (ref 0.44–1.00)
GFR calc Af Amer: >60 mL/min (ref >60)
GFR calc non Af Amer: >60 mL/min (ref >60)
Glucose, Bld: 95 mg/dL (ref 65–99)
Potassium: 3.5 mmol/L (ref 3.5–5.1)
Sodium: 141 mmol/L (ref 135–145)
Total Bilirubin: 0.4 mg/dL (ref 0.3–1.2)
Total Protein: 7.1 g/dL (ref 6.5–8.1)

## 2015-10-31 LAB — PREGNANCY, URINE: Preg Test, Ur: NEGATIVE

## 2015-10-31 MED ORDER — SODIUM CHLORIDE 0.9 % IV BOLUS (SEPSIS)
1000.0000 mL | Freq: Once | INTRAVENOUS | Status: AC
  Administered 2015-10-31: 1000 mL via INTRAVENOUS

## 2015-10-31 MED ORDER — ONDANSETRON HCL 4 MG/2ML IJ SOLN
4.0000 mg | Freq: Once | INTRAMUSCULAR | Status: AC
  Administered 2015-10-31: 4 mg via INTRAVENOUS
  Filled 2015-10-31: qty 2

## 2015-10-31 MED ORDER — PROMETHAZINE HCL 25 MG PO TABS: 25.0000 mg | ORAL_TABLET | Freq: Three times a day (TID) | ORAL | Status: DC | PRN

## 2015-10-31 MED ORDER — PROMETHAZINE HCL 25 MG/ML IJ SOLN
25.0000 mg | Freq: Once | INTRAMUSCULAR | Status: AC
  Administered 2015-10-31: 25 mg via INTRAVENOUS
  Filled 2015-10-31: qty 1

## 2015-10-31 MED ORDER — KETOROLAC TROMETHAMINE 30 MG/ML IJ SOLN
30.0000 mg | Freq: Once | INTRAMUSCULAR | Status: AC
  Administered 2015-10-31: 30 mg via INTRAVENOUS
  Filled 2015-10-31: qty 1

## 2015-10-31 NOTE — ED Notes (Signed)
C/o n/v/d since Friday with dizziness. C/o low abd cramps. Frequent urination. Body aches. No fever.
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
## 2015-10-31 NOTE — Discharge Instructions (Signed)
Your testing here today did not show any significant abnormality.  Return here as needed.  Follow-up with your Dr.

## 2016-12-19 ENCOUNTER — Encounter (HOSPITAL_BASED_OUTPATIENT_CLINIC_OR_DEPARTMENT_OTHER): Payer: Self-pay

## 2016-12-19 ENCOUNTER — Emergency Department (HOSPITAL_BASED_OUTPATIENT_CLINIC_OR_DEPARTMENT_OTHER)
Admission: EM | Admit: 2016-12-19 | Discharge: 2016-12-19 | Disposition: A | Discharge status: Home / Self Care | Payer: Medicaid Other | Attending: Emergency Medicine | Admitting: Emergency Medicine

## 2016-12-19 DIAGNOSIS — F209 Schizophrenia, unspecified: Secondary | ICD-10-CM | POA: Diagnosis not present

## 2016-12-19 DIAGNOSIS — Z87891 Personal history of nicotine dependence: Secondary | ICD-10-CM | POA: Insufficient documentation

## 2016-12-19 DIAGNOSIS — F909 Attention-deficit hyperactivity disorder, unspecified type: Secondary | ICD-10-CM | POA: Diagnosis not present

## 2016-12-19 DIAGNOSIS — F419 Anxiety disorder, unspecified: Secondary | ICD-10-CM

## 2016-12-19 DIAGNOSIS — R0602 Shortness of breath: Secondary | ICD-10-CM | POA: Diagnosis present

## 2016-12-19 DIAGNOSIS — I10 Essential (primary) hypertension: Secondary | ICD-10-CM | POA: Diagnosis not present

## 2016-12-19 LAB — CBC WITH DIFFERENTIAL/PLATELET
Basophils Absolute: 0.1 K/uL (ref 0.0–0.1)
Basophils Relative: 1 %
Eosinophils Absolute: 0.3 K/uL (ref 0.0–0.7)
Eosinophils Relative: 4 %
HCT: 30.4 % — ABNORMAL LOW (ref 36.0–46.0)
Hemoglobin: 9.1 g/dL — ABNORMAL LOW (ref 12.0–15.0)
Lymphocytes Relative: 20 %
Lymphs Abs: 1.5 K/uL (ref 0.7–4.0)
MCH: 21.2 pg — ABNORMAL LOW (ref 26.0–34.0)
MCHC: 29.9 g/dL — ABNORMAL LOW (ref 30.0–36.0)
MCV: 70.7 fL — ABNORMAL LOW (ref 78.0–100.0)
Monocytes Absolute: 0.6 K/uL (ref 0.1–1.0)
Monocytes Relative: 8 %
Neutro Abs: 5.1 K/uL (ref 1.7–7.7)
Neutrophils Relative %: 67 %
Platelets: 518 K/uL — ABNORMAL HIGH (ref 150–400)
RBC: 4.3 MIL/uL (ref 3.87–5.11)
RDW: 20.4 % — ABNORMAL HIGH (ref 11.5–15.5)
WBC: 7.6 K/uL (ref 4.0–10.5)

## 2016-12-19 LAB — BASIC METABOLIC PANEL
ANION GAP: 5 (ref 5–15)
BUN: 13 mg/dL (ref 6–20)
CO2: 26 mmol/L (ref 22–32)
Calcium: 9.4 mg/dL (ref 8.9–10.3)
Chloride: 107 mmol/L (ref 101–111)
Creatinine, Ser: 0.76 mg/dL (ref 0.44–1.00)
GFR calc Af Amer: >60 mL/min (ref >60)
Glucose, Bld: 102 mg/dL — ABNORMAL HIGH (ref 65–99)
POTASSIUM: 3.6 mmol/L (ref 3.5–5.1)
SODIUM: 138 mmol/L (ref 135–145)

## 2016-12-19 LAB — HCG, SERUM, QUALITATIVE: PREG SERUM: NEGATIVE

## 2016-12-19 MED ORDER — ALPRAZOLAM 0.5 MG PO TABS: 0.5000 mg | ORAL_TABLET | Freq: Three times a day (TID) | ORAL | 0 refills | Status: AC | PRN
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
## 2016-12-19 NOTE — Discharge Instructions (Signed)
Xanax as prescribed as needed for anxiety.  Follow-up with your primary Dr. to discuss your medications.

## 2016-12-19 NOTE — ED Triage Notes (Signed)
c/o SOB x since this am-"feel like these panic attacks are making my blood pressure go up"-pt NAD-steady gait

## 2016-12-19 NOTE — ED Notes (Signed)
Pt states she has not been on her psych meds or blood pressure medication in over a year because she became homeless and had not been able to see a doctor. Pt states she has been staying with family and is working again. States she has noticed she is having difficulty getting along with others and racing thoughts, denies SI/HI, states she is having panic attacks and feels SOB.

## 2017-03-12 IMAGING — MR MR PELVIS WO/W CM
8 of 10 series · 33 of 48 positions shown · IV contrast (16ml multihance)
Comparison: CT on 04/10/2015

CLINICAL DATA: Creatinine was obtained on site at [HOSPITAL]
[HOSPITAL] [HOSPITAL].

Results: Creatinine 0.6 mg/dL.
Fibroids. Menorrhagia. Preop evaluation for uterine artery
embolization.
EXAM:
MRI PELVIS WITHOUT AND WITH CONTRAST
TECHNIQUE: Multiplanar multisequence MR imaging of the pelvis was performed
both before and after administration of intravenous contrast.
CONTRAST:  16mL MULTIHANCE GADOBENATE DIMEGLUMINE 529 MG/ML IV SOLN

[Series 2: T2 · coronal · 6.0mm · 1.41mm/px · 4 of 26 slices shown]
[im 1/26]
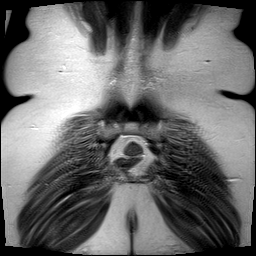
[im 9/26]
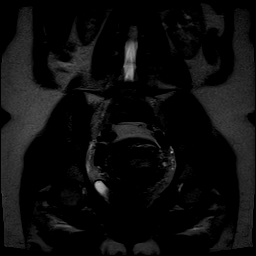
[im 17/26]
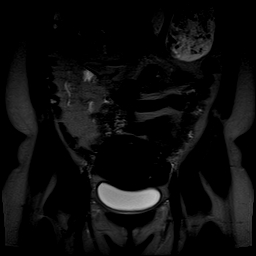
[im 26/26]
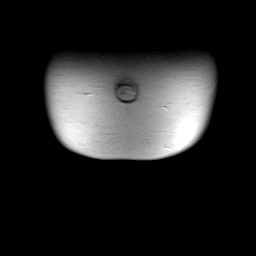

[Series 3: t2_tse axial · axial · 7.0mm · 0.51mm/px · z∈[-140,+78]mm · 4 of 25 slices shown]
[im 1/25]
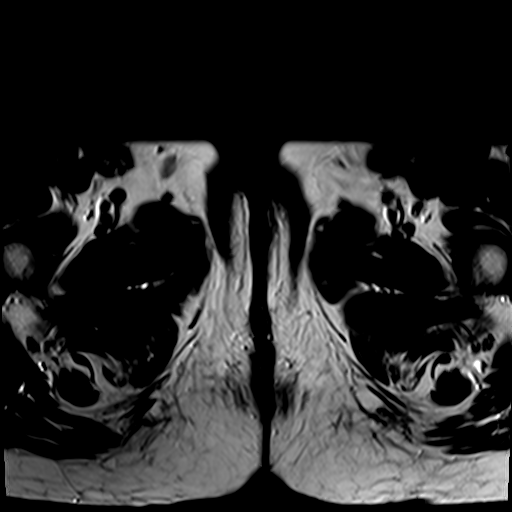
[im 9/25]
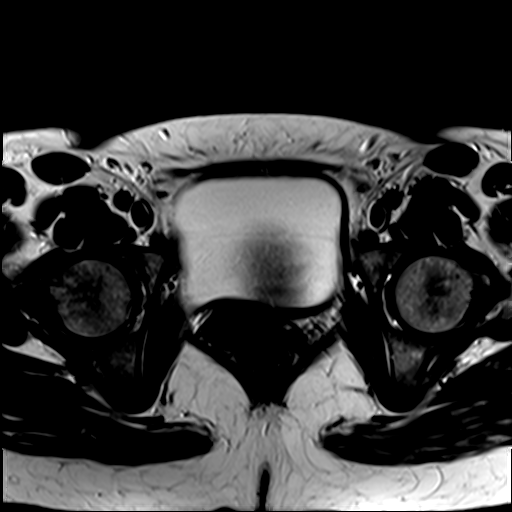
[im 17/25]
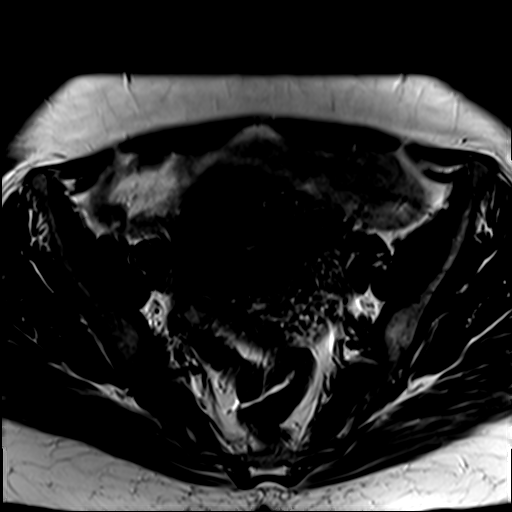
[im 25/25]
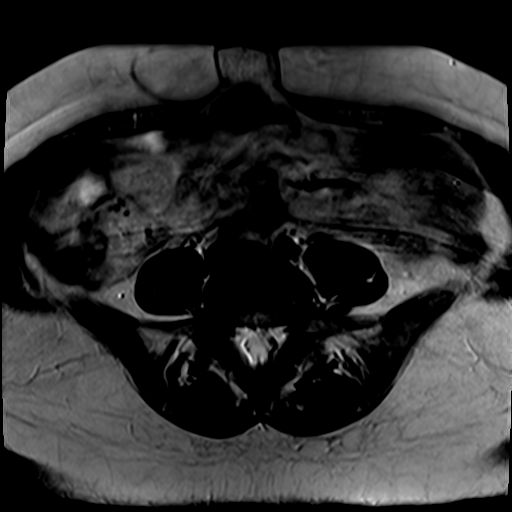

[Series 4: t2_tse axial fs · axial · 7.0mm · 0.51mm/px · z∈[-140,+78]mm · 4 of 25 slices shown]
[im 1/25]
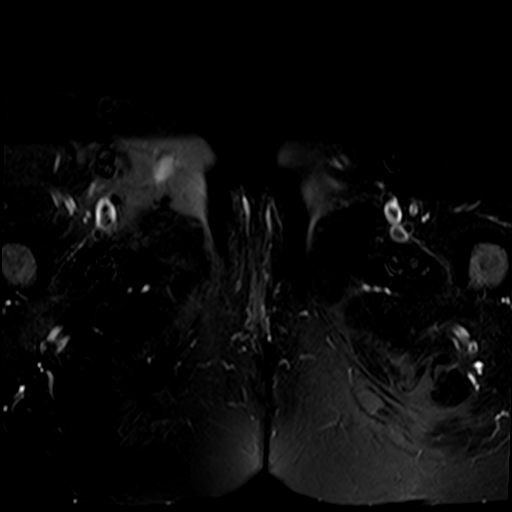
[im 9/25]
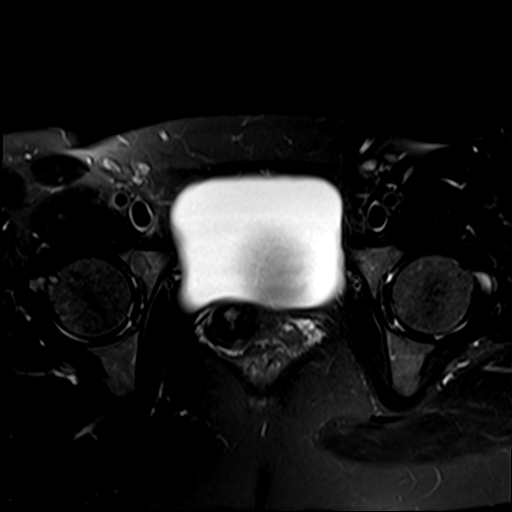
[im 17/25]
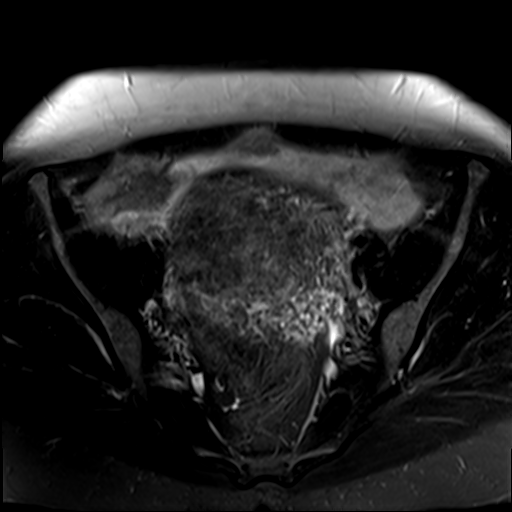
[im 25/25]
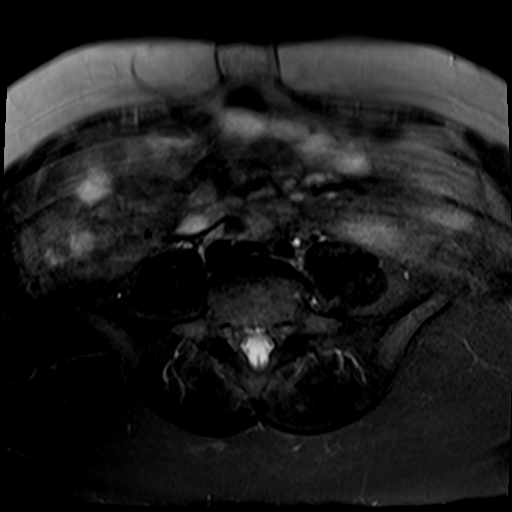

[Series 5: t2_tse_sag · sagittal · 5.0mm · 0.51mm/px · 5 of 30 slices shown]
[im 1/30]
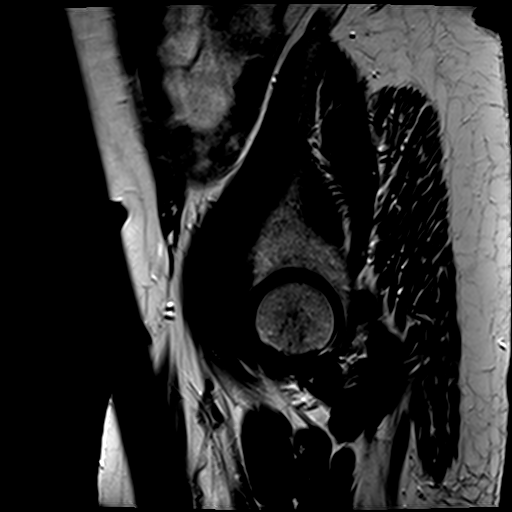
[im 8/30]
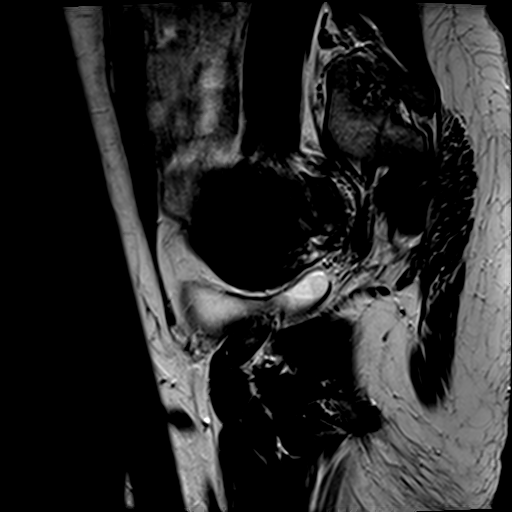
[im 15/30]
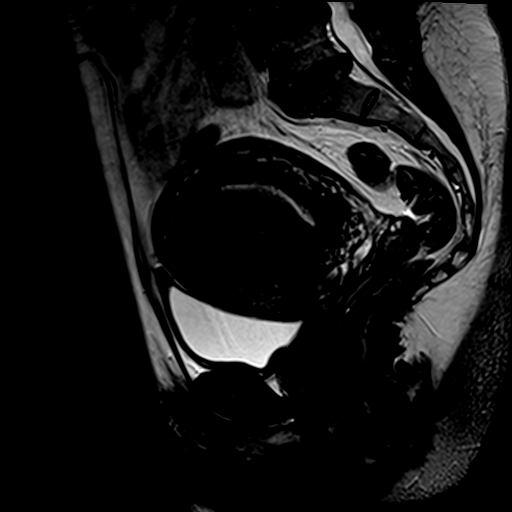
[im 22/30]
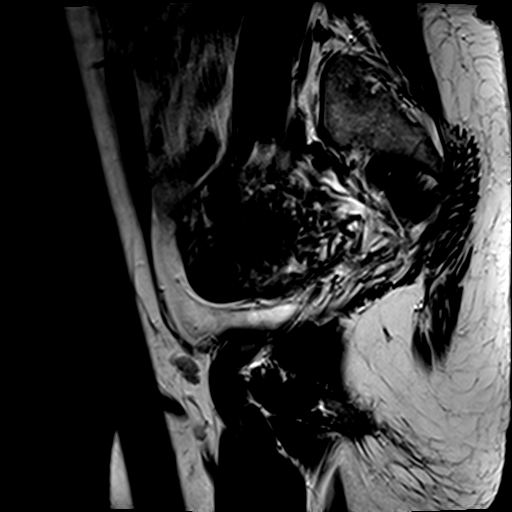
[im 30/30]
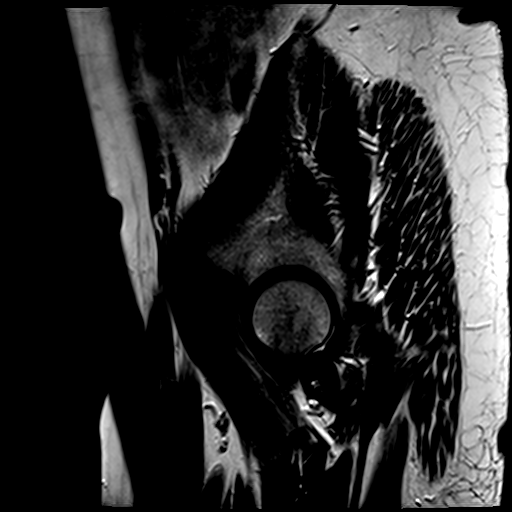

[Series 6: axial spgr-not bh · axial · 7.0mm · 1.02mm/px · z∈[-140,+78]mm · 5 of 25 slices shown]
[im 1/25]
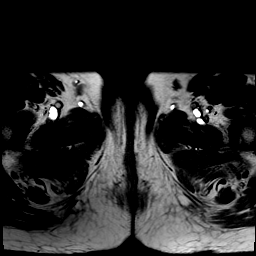
[im 7/25]
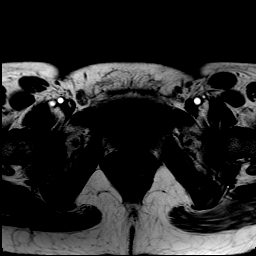
[im 13/25]
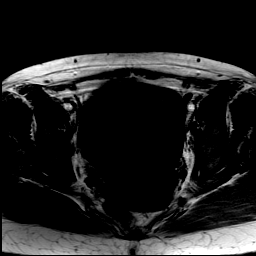
[im 19/25]
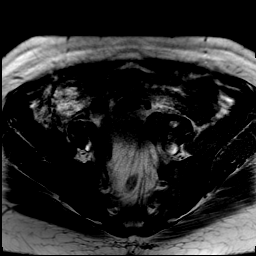
[im 25/25]
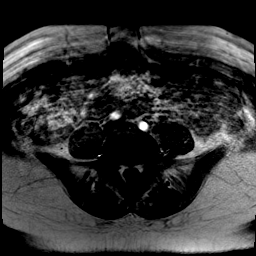

[Series 7: axial spgr fs-pre · axial · non-contrast · 7.0mm · 1.02mm/px · z∈[-132,+70]mm · 5 of 25 slices shown]
[im 1/25]
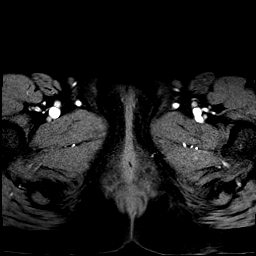
[im 7/25]
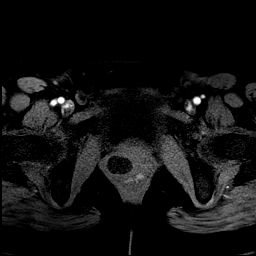
[im 13/25]
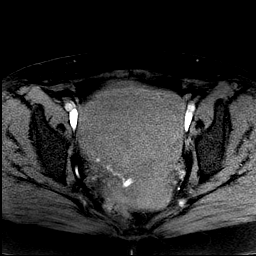
[im 19/25]
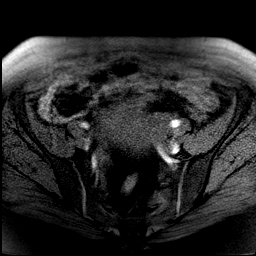
[im 25/25]
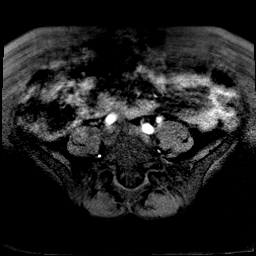

[Series 8: axial spgr fs-post · axial · 7.0mm · 1.02mm/px · z∈[-132,+70]mm · 5 of 25 slices shown (1 of 2)]
[im 1/25]
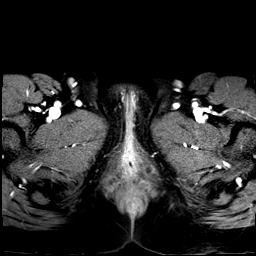
[im 7/25]
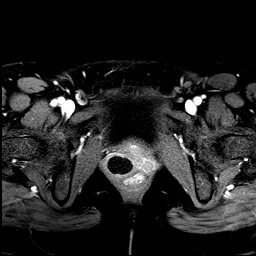
[im 13/25]
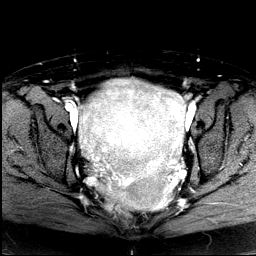
[im 19/25]
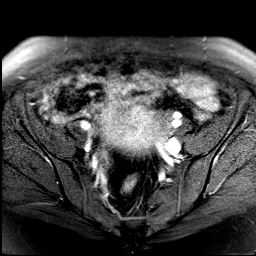
[im 25/25]
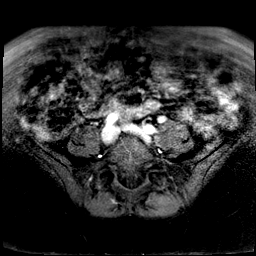

[Series 9: axial spgr fs-post · axial · 7.0mm · 1.02mm/px · 1 of 25 slices shown (2 of 2)]
[im 1/25]
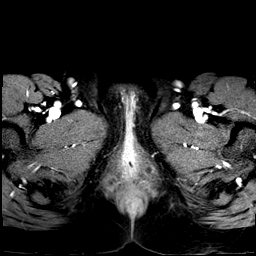

[33 of 48 positions shown; findings below may reference images not displayed]

FINDINGS: Uterus:  Measures 13.4 x 9.1 x 11.0 cm.   Estimated volume = 670 cc

Fibroids: The uterine myometrium shows severe focal segmental
adenomyosis in the anterior corpus and fundus. There are also
approximately 5 small intramural and submucosal fibroids which range
from 1 cm to 3 cm in maximum diameter.

Intracavitary fibroids:  None.

Pedunculated fibroids: None.

Fibroid contrast enhancement: The small fibroids show contrast
enhancement, without significant internal
degeneration/devascularization.

Endometrium:  Thin and unremarkable.

Cervix:  Normal appearance.  Tampon noted within vagina.

Right ovary:  Normal appearance.  No adnexal mass identified.

Left ovary:  Normal appearance.  No adnexal mass identified.

Free fluid:  None.

Other: No other pelvic masses, lymphadenopathy, or inflammatory
process identified.
IMPRESSION: Severe focal segmental adenomyosis in the anterior uterine corpus
and fundus. Approximately 5 small intramural and submucosal fibroids
are also seen ranging from 1-3 cm in diameter. No pedunculated or
intracavitary fibroids identified.

Normal appearance of both ovaries.  No adnexal mass identified.
